# Patient Record
Sex: Female | Born: 1970 | Race: White | Hispanic: No | Marital: Single | Smoking: Former smoker
Health system: Southern US, Community
[De-identification: ages and names within clinical notes are randomized; demographics above are authoritative.]

## PROBLEM LIST (undated history)

## (undated) DIAGNOSIS — K589 Irritable bowel syndrome without diarrhea: Secondary | ICD-10-CM

## (undated) DIAGNOSIS — K219 Gastro-esophageal reflux disease without esophagitis: Secondary | ICD-10-CM

## (undated) DIAGNOSIS — F419 Anxiety disorder, unspecified: Secondary | ICD-10-CM

## (undated) DIAGNOSIS — S322XXA Fracture of coccyx, initial encounter for closed fracture: Secondary | ICD-10-CM

## (undated) DIAGNOSIS — R011 Cardiac murmur, unspecified: Secondary | ICD-10-CM

## (undated) DIAGNOSIS — G709 Myoneural disorder, unspecified: Secondary | ICD-10-CM

## (undated) DIAGNOSIS — T7840XA Allergy, unspecified, initial encounter: Secondary | ICD-10-CM

## (undated) DIAGNOSIS — F329 Major depressive disorder, single episode, unspecified: Secondary | ICD-10-CM

## (undated) DIAGNOSIS — F32A Depression, unspecified: Secondary | ICD-10-CM

## (undated) HISTORY — DX: Cardiac murmur, unspecified: R01.1

## (undated) HISTORY — DX: Gastro-esophageal reflux disease without esophagitis: K21.9

## (undated) HISTORY — PX: COLONOSCOPY: SHX174

## (undated) HISTORY — DX: Anxiety disorder, unspecified: F41.9

## (undated) HISTORY — DX: Allergy, unspecified, initial encounter: T78.40XA

## (undated) HISTORY — DX: Myoneural disorder, unspecified: G70.9

## (undated) HISTORY — DX: Irritable bowel syndrome, unspecified: K58.9

## (undated) HISTORY — DX: Major depressive disorder, single episode, unspecified: F32.9

## (undated) HISTORY — PX: BREAST ENHANCEMENT SURGERY: SHX7

## (undated) HISTORY — DX: Depression, unspecified: F32.A

## (undated) HISTORY — DX: Fracture of coccyx, initial encounter for closed fracture: S32.2XXA

## (undated) HISTORY — PX: AUGMENTATION MAMMAPLASTY: SUR837

---

## 1996-10-18 HISTORY — PX: TUBAL LIGATION: SHX77

## 1998-09-18 ENCOUNTER — Encounter: Payer: Self-pay | Admitting: Emergency Medicine

## 1998-09-18 ENCOUNTER — Emergency Department (HOSPITAL_COMMUNITY): Admission: EM | Admit: 1998-09-18 | Discharge: 1998-09-18 | Payer: Self-pay | Admitting: Emergency Medicine

## 2001-10-12 ENCOUNTER — Other Ambulatory Visit: Admission: RE | Admit: 2001-10-12 | Discharge: 2001-10-12 | Payer: Self-pay | Admitting: Obstetrics and Gynecology

## 2003-03-06 ENCOUNTER — Other Ambulatory Visit: Admission: RE | Admit: 2003-03-06 | Discharge: 2003-03-06 | Payer: Self-pay | Admitting: Obstetrics and Gynecology

## 2004-01-29 ENCOUNTER — Emergency Department (HOSPITAL_COMMUNITY): Admission: EM | Admit: 2004-01-29 | Discharge: 2004-01-29 | Payer: Self-pay | Admitting: Emergency Medicine

## 2004-04-16 ENCOUNTER — Other Ambulatory Visit: Admission: RE | Admit: 2004-04-16 | Discharge: 2004-04-16 | Payer: Self-pay | Admitting: Obstetrics and Gynecology

## 2004-11-26 ENCOUNTER — Other Ambulatory Visit: Admission: RE | Admit: 2004-11-26 | Discharge: 2004-11-26 | Payer: Self-pay | Admitting: Obstetrics and Gynecology

## 2005-04-29 ENCOUNTER — Encounter: Admission: RE | Admit: 2005-04-29 | Discharge: 2005-04-29 | Payer: Self-pay | Admitting: Sports Medicine

## 2005-05-06 ENCOUNTER — Other Ambulatory Visit: Admission: RE | Admit: 2005-05-06 | Discharge: 2005-05-06 | Payer: Self-pay | Admitting: Obstetrics and Gynecology

## 2005-12-15 ENCOUNTER — Ambulatory Visit: Payer: Self-pay | Admitting: Gastroenterology

## 2006-02-07 ENCOUNTER — Ambulatory Visit: Payer: Self-pay | Admitting: Gastroenterology

## 2006-04-17 LAB — HM MAMMOGRAPHY: HM Mammogram: NORMAL

## 2008-01-23 ENCOUNTER — Ambulatory Visit: Payer: Self-pay | Admitting: Sports Medicine

## 2008-01-23 DIAGNOSIS — M545 Low back pain, unspecified: Secondary | ICD-10-CM | POA: Insufficient documentation

## 2008-01-23 DIAGNOSIS — M79609 Pain in unspecified limb: Secondary | ICD-10-CM | POA: Insufficient documentation

## 2008-01-23 DIAGNOSIS — M461 Sacroiliitis, not elsewhere classified: Secondary | ICD-10-CM | POA: Insufficient documentation

## 2008-01-23 DIAGNOSIS — S8410XA Injury of peroneal nerve at lower leg level, unspecified leg, initial encounter: Secondary | ICD-10-CM | POA: Insufficient documentation

## 2008-02-01 ENCOUNTER — Ambulatory Visit: Payer: Self-pay | Admitting: Internal Medicine

## 2008-02-01 ENCOUNTER — Emergency Department: Payer: Self-pay | Admitting: Internal Medicine

## 2008-02-29 ENCOUNTER — Encounter: Payer: Self-pay | Admitting: Family Medicine

## 2008-07-18 LAB — HM PAP SMEAR

## 2008-08-06 ENCOUNTER — Encounter: Payer: Self-pay | Admitting: Family Medicine

## 2008-08-09 ENCOUNTER — Encounter: Payer: Self-pay | Admitting: Family Medicine

## 2008-08-27 ENCOUNTER — Telehealth: Payer: Self-pay | Admitting: Gastroenterology

## 2008-08-28 ENCOUNTER — Emergency Department (HOSPITAL_COMMUNITY): Admission: EM | Admit: 2008-08-28 | Discharge: 2008-08-28 | Payer: Self-pay | Admitting: Emergency Medicine

## 2008-09-04 ENCOUNTER — Encounter: Payer: Self-pay | Admitting: Gastroenterology

## 2008-09-10 ENCOUNTER — Ambulatory Visit: Payer: Self-pay | Admitting: Gastroenterology

## 2008-09-10 DIAGNOSIS — K5900 Constipation, unspecified: Secondary | ICD-10-CM | POA: Insufficient documentation

## 2008-09-10 DIAGNOSIS — R109 Unspecified abdominal pain: Secondary | ICD-10-CM | POA: Insufficient documentation

## 2008-09-10 DIAGNOSIS — K3189 Other diseases of stomach and duodenum: Secondary | ICD-10-CM | POA: Insufficient documentation

## 2008-09-10 DIAGNOSIS — K219 Gastro-esophageal reflux disease without esophagitis: Secondary | ICD-10-CM | POA: Insufficient documentation

## 2008-09-10 DIAGNOSIS — K589 Irritable bowel syndrome without diarrhea: Secondary | ICD-10-CM | POA: Insufficient documentation

## 2008-09-10 DIAGNOSIS — R1013 Epigastric pain: Secondary | ICD-10-CM

## 2008-09-11 LAB — CONVERTED CEMR LAB
AST: 19 units/L (ref 0–37)
Albumin: 3.6 g/dL (ref 3.5–5.2)
Alkaline Phosphatase: 33 units/L — ABNORMAL LOW (ref 39–117)
BUN: 10 mg/dL (ref 6–23)
Calcium: 9.3 mg/dL (ref 8.4–10.5)
Chloride: 102 meq/L (ref 96–112)
GFR calc non Af Amer: 99 mL/min
Glucose, Bld: 81 mg/dL (ref 70–99)
Potassium: 3.3 meq/L — ABNORMAL LOW (ref 3.5–5.1)
Sodium: 141 meq/L (ref 135–145)
Total Protein: 6.6 g/dL (ref 6.0–8.3)

## 2008-09-24 ENCOUNTER — Ambulatory Visit: Payer: Self-pay | Admitting: Gastroenterology

## 2008-11-06 ENCOUNTER — Ambulatory Visit: Payer: Self-pay | Admitting: Gastroenterology

## 2008-12-02 ENCOUNTER — Telehealth: Payer: Self-pay | Admitting: Gastroenterology

## 2008-12-02 ENCOUNTER — Emergency Department (HOSPITAL_COMMUNITY): Admission: EM | Admit: 2008-12-02 | Discharge: 2008-12-02 | Payer: Self-pay | Admitting: Emergency Medicine

## 2009-04-22 ENCOUNTER — Ambulatory Visit: Payer: Self-pay | Admitting: *Deleted

## 2009-04-22 ENCOUNTER — Inpatient Hospital Stay (HOSPITAL_COMMUNITY): Admission: RE | Admit: 2009-04-22 | Discharge: 2009-04-24 | Payer: Self-pay | Admitting: *Deleted

## 2009-04-22 ENCOUNTER — Emergency Department (HOSPITAL_COMMUNITY): Admission: EM | Admit: 2009-04-22 | Discharge: 2009-04-22 | Payer: Self-pay | Admitting: Emergency Medicine

## 2009-06-17 ENCOUNTER — Ambulatory Visit: Payer: Self-pay | Admitting: Family Medicine

## 2009-06-17 DIAGNOSIS — J309 Allergic rhinitis, unspecified: Secondary | ICD-10-CM | POA: Insufficient documentation

## 2009-06-17 DIAGNOSIS — F329 Major depressive disorder, single episode, unspecified: Secondary | ICD-10-CM | POA: Insufficient documentation

## 2009-06-17 DIAGNOSIS — M797 Fibromyalgia: Secondary | ICD-10-CM | POA: Insufficient documentation

## 2009-06-24 ENCOUNTER — Encounter: Payer: Self-pay | Admitting: Family Medicine

## 2009-06-30 ENCOUNTER — Ambulatory Visit: Payer: Self-pay | Admitting: Family Medicine

## 2009-07-01 LAB — CONVERTED CEMR LAB
BUN: 11 mg/dL (ref 6–23)
Bilirubin, Direct: 0 mg/dL (ref 0.0–0.3)
Cholesterol: 188 mg/dL (ref 0–200)
Creatinine, Ser: 0.8 mg/dL (ref 0.4–1.2)
GFR calc non Af Amer: 84.29 mL/min (ref >60)
LDL Cholesterol: 126 mg/dL — ABNORMAL HIGH (ref 0–99)
TSH: 1.13 microintl units/mL (ref 0.35–5.50)
Total Bilirubin: 0.6 mg/dL (ref 0.3–1.2)
Total CHOL/HDL Ratio: 4
Triglycerides: 82 mg/dL (ref 0.0–149.0)
VLDL: 16.4 mg/dL (ref 0.0–40.0)
Vitamin B-12: 473 pg/mL (ref 211–911)

## 2009-07-04 ENCOUNTER — Ambulatory Visit: Payer: Self-pay | Admitting: Family Medicine

## 2009-07-10 ENCOUNTER — Telehealth: Payer: Self-pay | Admitting: Family Medicine

## 2009-07-11 ENCOUNTER — Ambulatory Visit: Payer: Self-pay | Admitting: Family Medicine

## 2009-08-22 ENCOUNTER — Encounter: Payer: Self-pay | Admitting: Family Medicine

## 2009-08-22 ENCOUNTER — Other Ambulatory Visit: Admission: RE | Admit: 2009-08-22 | Discharge: 2009-08-22 | Payer: Self-pay | Admitting: Family Medicine

## 2009-08-22 ENCOUNTER — Ambulatory Visit: Payer: Self-pay | Admitting: Family Medicine

## 2009-08-27 ENCOUNTER — Encounter (INDEPENDENT_AMBULATORY_CARE_PROVIDER_SITE_OTHER): Payer: Self-pay | Admitting: *Deleted

## 2009-10-01 ENCOUNTER — Telehealth: Payer: Self-pay | Admitting: Family Medicine

## 2011-01-24 LAB — CBC
Hemoglobin: 12.6 g/dL (ref 12.0–15.0)
MCHC: 33.8 g/dL (ref 30.0–36.0)
RBC: 4.11 MIL/uL (ref 3.87–5.11)

## 2011-01-24 LAB — ACETAMINOPHEN LEVEL: Acetaminophen (Tylenol), Serum: <10 ug/mL — ABNORMAL LOW (ref 10–30)

## 2011-01-24 LAB — DIFFERENTIAL
Basophils Absolute: 0 10*3/uL (ref 0.0–0.1)
Basophils Relative: 0 % (ref 0–1)
Lymphocytes Relative: 33 % (ref 12–46)
Monocytes Absolute: 0.6 10*3/uL (ref 0.1–1.0)
Monocytes Relative: 7 % (ref 3–12)
Neutro Abs: 5.3 10*3/uL (ref 1.7–7.7)
Neutrophils Relative %: 59 % (ref 43–77)

## 2011-01-24 LAB — BASIC METABOLIC PANEL
CO2: 27 mEq/L (ref 19–32)
Calcium: 8.5 mg/dL (ref 8.4–10.5)
Creatinine, Ser: 0.67 mg/dL (ref 0.4–1.2)
GFR calc Af Amer: >60 mL/min (ref >60)
GFR calc non Af Amer: >60 mL/min (ref >60)
Sodium: 140 mEq/L (ref 135–145)

## 2011-01-24 LAB — ETHANOL: Alcohol, Ethyl (B): <5 mg/dL (ref 0–10)

## 2011-01-24 LAB — RAPID URINE DRUG SCREEN, HOSP PERFORMED
Amphetamines: NOT DETECTED
Opiates: NOT DETECTED
Tetrahydrocannabinol: NOT DETECTED

## 2011-01-24 LAB — POCT PREGNANCY, URINE: Preg Test, Ur: NEGATIVE
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
## 2011-07-20 LAB — POCT URINALYSIS DIP (DEVICE)
Bilirubin Urine: NEGATIVE
Nitrite: NEGATIVE
Operator id: 239701
Protein, ur: NEGATIVE
pH: 5.5

## 2011-07-20 LAB — POCT PREGNANCY, URINE: Preg Test, Ur: NEGATIVE

## 2011-07-20 LAB — WET PREP, GENITAL

## 2011-08-17 ENCOUNTER — Other Ambulatory Visit: Payer: Self-pay | Admitting: Obstetrics and Gynecology

## 2011-09-07 ENCOUNTER — Other Ambulatory Visit: Payer: Self-pay | Admitting: Obstetrics and Gynecology

## 2011-09-07 DIAGNOSIS — R928 Other abnormal and inconclusive findings on diagnostic imaging of breast: Secondary | ICD-10-CM

## 2011-09-21 ENCOUNTER — Ambulatory Visit
Admission: RE | Admit: 2011-09-21 | Discharge: 2011-09-21 | Disposition: A | Discharge status: Home / Self Care | Payer: BC Managed Care – PPO | Source: Ambulatory Visit | Attending: Obstetrics and Gynecology | Admitting: Obstetrics and Gynecology

## 2011-09-21 DIAGNOSIS — R928 Other abnormal and inconclusive findings on diagnostic imaging of breast: Secondary | ICD-10-CM

## 2011-11-08 ENCOUNTER — Ambulatory Visit (INDEPENDENT_AMBULATORY_CARE_PROVIDER_SITE_OTHER): Payer: BC Managed Care – PPO | Admitting: Family Medicine

## 2011-11-08 ENCOUNTER — Encounter: Payer: Self-pay | Admitting: Family Medicine

## 2011-11-08 VITALS — BP 110/70 | HR 68 | Temp 98.4°F | Ht 65.5 in | Wt 144.2 lb

## 2011-11-08 DIAGNOSIS — H6693 Otitis media, unspecified, bilateral: Secondary | ICD-10-CM

## 2011-11-08 DIAGNOSIS — H8309 Labyrinthitis, unspecified ear: Secondary | ICD-10-CM

## 2011-11-08 DIAGNOSIS — H6690 Otitis media, unspecified, unspecified ear: Secondary | ICD-10-CM

## 2011-11-08 MED ORDER — ANTIPYRINE-BENZOCAINE 5.4-1.4 % OT SOLN: 3.0000 [drp] | OTIC | Status: AC | PRN

## 2011-11-08 MED ORDER — AMOXICILLIN 500 MG PO CAPS: 1000.0000 mg | ORAL_CAPSULE | Freq: Two times a day (BID) | ORAL | Status: AC

## 2011-11-08 NOTE — Progress Notes (Signed)
  Patient Name: Kara Haas Date of Birth: 12-28-1970 Age: 41 y.o. Medical Record Number: [id number] Gender: female Date of Encounter: 11/08/2011  History of Present Illness:  Kara Haas is a 41 y.o. very pleasant female patient who presents with the following:  Friday -- pressure in both ears and some difficulty hearing. More on the right compared to the right. Also a lot of nasal congestion. Overall, does not feel well. A little dizzy or off balance.   4-5 recent infections over the winter B ear pressure now No n/v/d, no myalgia or arthralgia.  Past Medical History, Surgical History, Social History, Family History, Problem List, Medications, and Allergies have been reviewed and updated if relevant.  Review of Systems: ROS: GEN: Acute illness details above GI: Tolerating PO intake GU: maintaining adequate hydration and urination Pulm: No SOB Interactive and getting along well at home.  Otherwise, ROS is as per the HPI.   Physical Examination: Filed Vitals:   11/08/11 0935  BP: 110/70  Pulse: 68  Temp: 98.4 F (36.9 C)  TempSrc: Oral  Height: 5' 5.5" (1.664 m)  Weight: 144 lb 4 oz (65.431 kg)    Body mass index is 23.64 kg/(m^2).   Gen: WDWN, NAD; A & O x3, cooperative. Pleasant.Globally Non-toxic HEENT: Normocephalic and atraumatic. Throat clear, w/o exudate, R and L RM obscured landmarks with discolored fluid. rhinnorhea.  MMM Frontal sinuses: NT Max sinuses: NT NECK: Anterior cervical  LAD is absent CV: RRR, No M/G/R, cap refill <2 sec PULM: Breathing comfortably in no respiratory distress. no wheezing, crackles, rhonchi EXT: No c/c/e PSYCH: Friendly, good eye contact MSK: Nml gait    Assessment and Plan: 1. Otitis media of both ears  amoxicillin (AMOXIL) 500 MG capsule, antipyrine-benzocaine (AURALGAN) otic solution  2. Labyrinthitis      Also dramamine prn for mild vertigo

## 2012-02-18 ENCOUNTER — Ambulatory Visit (INDEPENDENT_AMBULATORY_CARE_PROVIDER_SITE_OTHER): Payer: BC Managed Care – PPO | Admitting: Family Medicine

## 2012-02-18 ENCOUNTER — Encounter: Payer: Self-pay | Admitting: Family Medicine

## 2012-02-18 VITALS — BP 124/78 | HR 56 | Temp 98.2°F | Wt 141.8 lb

## 2012-02-18 DIAGNOSIS — J019 Acute sinusitis, unspecified: Secondary | ICD-10-CM

## 2012-02-18 MED ORDER — AMOXICILLIN-POT CLAVULANATE 875-125 MG PO TABS: 1.0000 | ORAL_TABLET | Freq: Two times a day (BID) | ORAL | Status: AC

## 2012-02-18 MED ORDER — FLUTICASONE PROPIONATE 50 MCG/ACT NA SUSP: 2.0000 | Freq: Every day | NASAL | Status: DC

## 2012-02-18 MED ORDER — CETIRIZINE-PSEUDOEPHEDRINE ER 5-120 MG PO TB12: 1.0000 | ORAL_TABLET | Freq: Two times a day (BID) | ORAL | Status: DC

## 2012-02-18 NOTE — Progress Notes (Signed)
  Subjective:    Patient ID: Kara Haas, female    DOB: Feb 04, 1971, 41 y.o.   MRN: [id number]  HPI CC: "i have a sinus infection"  Back 10/2011 had fluid behind ears, given abx abut lasted 3-4 wks, saw ENT and had more abx.  Finally cleared up.  Has had severa URTI this year.  Feels currently with some fluid behind ears, has discussed tympanostomy with ENT.  Last saw ENT 11/2011.  Wonders what she needs to do to help control allergies.  This past week sinuses more congested.  Headache, facial pressure, dizziness.  R ear pain.  Possible low grade fever.  Dry cough present.  + PNdrainage - clear.  Blowing nose with clear mucous.  Dizziness described as worse with sudden movements, associated with sick stomach.  Taking flonase (but not daily), zyrtec, vit C, guaifenesin PRN (lots of water with this).  Has tried claritin and several other brands.    No fevers/chills, n/v, rash, tooth pain.   Sick contacts at work.  Smoking 2 cig/day.  Motivated to quit.  No h/o asthma.  Review of Systems Per HPI    Objective:   Physical Exam  Nursing note and vitals reviewed. Constitutional: She appears well-developed and well-nourished. No distress.  HENT:  Head: Normocephalic and atraumatic.  Right Ear: Hearing, external ear and ear canal normal.  Left Ear: Hearing, tympanic membrane, external ear and ear canal normal.  Nose: Mucosal edema and rhinorrhea present. Right sinus exhibits frontal sinus tenderness. Right sinus exhibits no maxillary sinus tenderness. Left sinus exhibits frontal sinus tenderness. Left sinus exhibits no maxillary sinus tenderness.  Mouth/Throat: Uvula is midline, oropharynx is clear and moist and mucous membranes are normal. No oropharyngeal exudate, posterior oropharyngeal edema, posterior oropharyngeal erythema or tonsillar abscesses.       Fluid behind R TM PNdrainage present  Eyes: Conjunctivae and EOM are normal. Pupils are equal, round, and reactive to light. No scleral  icterus.  Neck: Normal range of motion. Neck supple.  Cardiovascular: Normal rate, regular rhythm, normal heart sounds and intact distal pulses.   No murmur heard. Pulmonary/Chest: Effort normal and breath sounds normal. No respiratory distress. She has no wheezes. She has no rales.  Lymphadenopathy:    She has no cervical adenopathy.  Skin: Skin is warm and dry. No rash noted.       Assessment & Plan:

## 2012-02-18 NOTE — Assessment & Plan Note (Signed)
Given history, as well as current serous otitis, will treat aggressively with augmentin. ?vestibular neuritis component leading to dizziness. See pt instructions for other recommendations. Pt declines dizziness med. If not better, update [place], consider singulair for allergies.

## 2012-02-18 NOTE — Patient Instructions (Signed)
You have a sinus infection along with fluid in right ear. Take medicine as prescribed: Start augmentin twice daily for 10 days.  Start flonase regularly.  Start zyrtec D twice a day. Push fluids and plenty of rest. Nasal saline irrigation or neti pot to help drain sinuses. May use simple mucinex with plenty of fluid to help mobilize mucous. Let us know if fever >101.5, trouble opening/closing mouth, difficulty swallowing, or worsening - you may need to be seen again.

## 2012-08-10 ENCOUNTER — Other Ambulatory Visit: Payer: Self-pay | Admitting: Family Medicine

## 2012-08-10 ENCOUNTER — Other Ambulatory Visit: Payer: Self-pay | Admitting: Obstetrics and Gynecology

## 2012-08-10 DIAGNOSIS — Z1231 Encounter for screening mammogram for malignant neoplasm of breast: Secondary | ICD-10-CM

## 2012-08-31 ENCOUNTER — Ambulatory Visit
Admission: RE | Admit: 2012-08-31 | Discharge: 2012-08-31 | Disposition: A | Discharge status: Home / Self Care | Payer: BC Managed Care – PPO | Source: Ambulatory Visit | Attending: Obstetrics and Gynecology | Admitting: Obstetrics and Gynecology

## 2012-08-31 DIAGNOSIS — Z1231 Encounter for screening mammogram for malignant neoplasm of breast: Secondary | ICD-10-CM

## 2012-09-15 ENCOUNTER — Other Ambulatory Visit: Payer: Self-pay | Admitting: Obstetrics and Gynecology

## 2012-09-15 DIAGNOSIS — R928 Other abnormal and inconclusive findings on diagnostic imaging of breast: Secondary | ICD-10-CM

## 2012-09-26 ENCOUNTER — Ambulatory Visit
Admission: RE | Admit: 2012-09-26 | Discharge: 2012-09-26 | Disposition: A | Discharge status: Home / Self Care | Payer: BC Managed Care – PPO | Source: Ambulatory Visit | Attending: Obstetrics and Gynecology | Admitting: Obstetrics and Gynecology

## 2012-09-26 ENCOUNTER — Other Ambulatory Visit: Payer: Self-pay | Admitting: Obstetrics and Gynecology

## 2012-09-26 ENCOUNTER — Other Ambulatory Visit (HOSPITAL_COMMUNITY)
Admission: RE | Admit: 2012-09-26 | Discharge: 2012-09-26 | Disposition: A | Discharge status: Home / Self Care | Payer: BC Managed Care – PPO | Source: Ambulatory Visit | Attending: Diagnostic Radiology | Admitting: Diagnostic Radiology

## 2012-09-26 DIAGNOSIS — R928 Other abnormal and inconclusive findings on diagnostic imaging of breast: Secondary | ICD-10-CM

## 2012-09-26 DIAGNOSIS — N6009 Solitary cyst of unspecified breast: Secondary | ICD-10-CM | POA: Insufficient documentation

## 2012-11-28 ENCOUNTER — Ambulatory Visit (INDEPENDENT_AMBULATORY_CARE_PROVIDER_SITE_OTHER): Payer: BC Managed Care – PPO | Admitting: Family Medicine

## 2012-11-28 ENCOUNTER — Encounter: Payer: Self-pay | Admitting: Family Medicine

## 2012-11-28 VITALS — BP 110/72 | HR 75 | Temp 98.8°F | Ht 65.5 in | Wt 144.8 lb

## 2012-11-28 DIAGNOSIS — J019 Acute sinusitis, unspecified: Secondary | ICD-10-CM

## 2012-11-28 MED ORDER — AMOXICILLIN 500 MG PO TABS: 1000.0000 mg | ORAL_TABLET | Freq: Two times a day (BID) | ORAL | Status: DC

## 2012-11-28 MED ORDER — FLUTICASONE PROPIONATE 50 MCG/ACT NA SUSP: NASAL | Status: DC

## 2012-11-28 NOTE — Patient Instructions (Addendum)
Mucinex, nasal saline 2-3 time a day. Continue allergy medications. Use amoxicillin for 10 days. Call if not improving as expected.

## 2012-11-28 NOTE — Progress Notes (Signed)
  Subjective:    Patient ID: Kara Haas, female    DOB: April 30, 1971, 42 y.o.   MRN: [id number]  HPI 42 year old female presents with  1 week of frontal headache, congestion, low grade temp in last 3 days. Body ache. No SOB. Decreased energy.  This AM maxillary and frontal sinus pain worse. Headache. Ears painful B.  Tooth pain on bottom.  Thick yellow purulent mucus.  Using mucinex cold and flu. Pheneylephperine for decongestant. Flonase and allergy meds. Minimal help with symptoms.    Review of Systems  Constitutional: Negative for fever and fatigue.  HENT: Negative for ear pain.   Eyes: Negative for pain.  Respiratory: Negative for chest tightness and shortness of breath.   Cardiovascular: Negative for chest pain, palpitations and leg swelling.  Gastrointestinal: Negative for abdominal pain.  Genitourinary: Negative for dysuria.       Objective:   Physical Exam  Constitutional: Vital signs are normal. She appears well-developed and well-nourished. She is cooperative.  Non-toxic appearance. She does not appear ill. No distress.  HENT:  Head: Normocephalic.  Right Ear: Hearing, tympanic membrane, external ear and ear canal normal. Tympanic membrane is not erythematous, not retracted and not bulging.  Left Ear: Hearing, tympanic membrane, external ear and ear canal normal. Tympanic membrane is not erythematous, not retracted and not bulging.  Nose: Mucosal edema and rhinorrhea present. Right sinus exhibits maxillary sinus tenderness and frontal sinus tenderness. Left sinus exhibits maxillary sinus tenderness and frontal sinus tenderness.  Mouth/Throat: Uvula is midline, oropharynx is clear and moist and mucous membranes are normal.  Eyes: Conjunctivae, EOM and lids are normal. Pupils are equal, round, and reactive to light. No foreign bodies found.  Neck: Trachea normal and normal range of motion. Neck supple. Carotid bruit is not present. No mass and no thyromegaly present.   Cardiovascular: Normal rate, regular rhythm, S1 normal, S2 normal, normal heart sounds, intact distal pulses and normal pulses.  Exam reveals no gallop and no friction rub.   No murmur heard. Pulmonary/Chest: Effort normal and breath sounds normal. Not tachypneic. No respiratory distress. She has no decreased breath sounds. She has no wheezes. She has no rhonchi. She has no rales.  Neurological: She is alert.  Skin: Skin is warm, dry and intact. No rash noted.  Psychiatric: Her speech is normal and behavior is normal. Judgment normal. Her mood appears not anxious. Cognition and memory are normal. She does not exhibit a depressed mood.          Assessment & Plan:

## 2012-11-28 NOTE — Assessment & Plan Note (Signed)
Given length of time of symptoms and new fever. Treat with antibiotics and symptomatic care.

## 2013-01-17 ENCOUNTER — Telehealth: Payer: Self-pay | Admitting: Family Medicine

## 2013-01-17 ENCOUNTER — Telehealth: Payer: Self-pay

## 2013-01-17 NOTE — Telephone Encounter (Signed)
Agree with dispo in RLQ pain.   Hannah Beat, MD 01/17/2013, 7:27 PM

## 2013-01-17 NOTE — Telephone Encounter (Signed)
CAN calls at 5 pm with disposition for pt to be seen now;  This morning 1 am to approx 4 am had severe RLQ stabbing pain; thru out today pt has had moderate pain constantly in RLQ with abd bloating. No fever.  Advised at 5 pm if CAN advises to be seen now pt will need to be eval at ED. Camera will notify pt.

## 2013-01-17 NOTE — Telephone Encounter (Signed)
Patient Information:  Caller Name: Summar  Phone: [phone]  Patient: Kara Haas  Gender: Female  DOB: 15-Aug-1971  Age: 42 Years  PCP: Kerby Nora (Family Practice)  Pregnant: No  Office Follow Up:  Does the office need to follow up with this patient?: Yes  Instructions For The Office: Spoke with Raena, nurse, at the office and she agreed too late to be seen at the office and ok to go to the ED.  RN Note:  Onset 01/17/2013  woke up @ 1:00 with SEVERE lower right quadrant pain and was not relieved by taking Advil (X3tablets)  until 3:30 when she fell asleep. She awoke again with pain and could not move. Since waking up and at work, has constant MODERATE paint "sore" below naval and more lateral). She does have abdomen bloating (she states PMS, due "anyday". Did have a normal bowel movement yesterday and states, "it is not constipation". She denies any fever.   Symptoms  Reason For Call & Symptoms: 01/18/2012 from 1am-3am had intense pain lower right side and could not move. ,  Reviewed Health History In EMR: Yes  Reviewed Medications In EMR: Yes  Reviewed Allergies In EMR: Yes  Reviewed Surgeries / Procedures: Yes  Date of Onset of Symptoms: 01/17/2013  Treatments Tried: ADVIL 3 tablets, 600mg  @ 1L30and got a relief by 3:30.  Treatments Tried Worked: No OB / GYN:  LMP: 12/19/2012  Guideline(s) Used:  Abdominal Pain - Female  Disposition Per Guideline:   See Today in Office  Reason For Disposition Reached:   Patient wants to be seen  Advice Given:  Reassurance:  A mild stomachache can be caused by indigestion, gas pains or overeating. Sometimes a stomachache signals the onset of a vomiting illness due to a viral gastroenteritis ("stomach flu").  Here is some care advice that should help.  Rest:  Lie down and rest until you feel better.  Fluids:  Sip clear fluids only (e.g., water, flat soft drinks or 1/2 strength fruit juice) until the pain has been gone for over 2 hours.  Then slowly return to a regular diet.  Pass a BM:  Sit on the toilet and try to pass a bowel movement (BM). Do not strain. This may relieve the pain if it is due to constipation or impending diarrhea.  Pass a BM:  Sit on the toilet and try to pass a bowel movement (BM). Do not strain. This may relieve the pain if it is due to constipation or impending diarrhea.  Call Back If:  Abdominal pain is constant and present for more than 2 hours  You become worse.  RN Overrode Recommendation:  Go To U.C.  RN/CAN advised going to the ED, she preferred Urgent Care if acceptable. RN/CAN stated ok to be seen in Urgent Care, they would do detailed assessment and if necessary send to the ED.

## 2013-01-18 ENCOUNTER — Ambulatory Visit (INDEPENDENT_AMBULATORY_CARE_PROVIDER_SITE_OTHER)
Admission: RE | Admit: 2013-01-18 | Discharge: 2013-01-18 | Disposition: A | Discharge status: Home / Self Care | Payer: BC Managed Care – PPO | Source: Ambulatory Visit | Attending: Family Medicine | Admitting: Family Medicine

## 2013-01-18 ENCOUNTER — Encounter: Payer: Self-pay | Admitting: Family Medicine

## 2013-01-18 ENCOUNTER — Ambulatory Visit (INDEPENDENT_AMBULATORY_CARE_PROVIDER_SITE_OTHER): Payer: BC Managed Care – PPO | Admitting: Family Medicine

## 2013-01-18 VITALS — BP 100/70 | HR 58 | Temp 98.3°F | Wt 143.5 lb

## 2013-01-18 DIAGNOSIS — R1031 Right lower quadrant pain: Secondary | ICD-10-CM

## 2013-01-18 DIAGNOSIS — R109 Unspecified abdominal pain: Secondary | ICD-10-CM

## 2013-01-18 LAB — CBC WITH DIFFERENTIAL/PLATELET
Basophils Relative: 0.4 % (ref 0.0–3.0)
Eosinophils Absolute: 0.1 10*3/uL (ref 0.0–0.7)
Eosinophils Relative: 1.1 % (ref 0.0–5.0)
HCT: 37.2 % (ref 36.0–46.0)
Lymphs Abs: 1.9 10*3/uL (ref 0.7–4.0)
MCHC: 33.5 g/dL (ref 30.0–36.0)
MCV: 88.2 fl (ref 78.0–100.0)
Monocytes Absolute: 0.4 10*3/uL (ref 0.1–1.0)
Neutrophils Relative %: 63.1 % (ref 43.0–77.0)
Platelets: 230 10*3/uL (ref 150.0–400.0)
RBC: 4.21 Mil/uL (ref 3.87–5.11)
WBC: 6.5 10*3/uL (ref 4.5–10.5)

## 2013-01-18 LAB — POCT UA - MICROSCOPIC ONLY

## 2013-01-18 LAB — COMPREHENSIVE METABOLIC PANEL
AST: 15 U/L (ref 0–37)
Alkaline Phosphatase: 35 U/L — ABNORMAL LOW (ref 39–117)
BUN: 9 mg/dL (ref 6–23)
Glucose, Bld: 83 mg/dL (ref 70–99)
Total Bilirubin: 0.7 mg/dL (ref 0.3–1.2)

## 2013-01-18 LAB — POCT URINALYSIS DIPSTICK
Bilirubin, UA: NEGATIVE
Glucose, UA: NEGATIVE
Spec Grav, UA: <=1.005
Urobilinogen, UA: 0.2

## 2013-01-18 MED ORDER — IOHEXOL 300 MG/ML  SOLN
100.0000 mL | Freq: Once | INTRAMUSCULAR | Status: AC | PRN
  Administered 2013-01-18: 100 mL via INTRAVENOUS

## 2013-01-18 NOTE — Progress Notes (Signed)
  Subjective:    Patient ID: Kara Haas, female    DOB: [birth year], 42 y.o.   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 Went away with 3 advil. Following morning right abdomen was sore. Some bloating. Last night awoke again in middle of night with pain same area, not as severe  Urine today looked like it had vaginal white stringy discharge. When she urinates she feels some pressure on right side.  Mild nausea. No vomiting, no diarrhea or constipation. No fever. No dysuria, no frequency, no urgency.  She is sexually active.. No new partner, no concern about STDs.  no vaginal lesions or itching.  Menses is due anytime this week. Usually has bad cramps but nothing like this before.  She still has appendix and ovaries.  She had UTI 2 weeks ago.. Took AZO symptoms reolved on own.   Review of Systems  Constitutional: Negative for fever and fatigue.  HENT: Negative for ear pain.   Eyes: Negative for pain.  Respiratory: Negative for chest tightness and shortness of breath.   Cardiovascular: Negative for chest pain, palpitations and leg swelling.  Gastrointestinal: Positive for abdominal pain. Negative for anal bleeding.  Genitourinary: Positive for vaginal discharge. Negative for dysuria, urgency, frequency, vaginal pain and pelvic pain.       Objective:   Physical Exam  Constitutional: Vital signs are normal. She appears well-developed and well-nourished. She is cooperative.  Non-toxic appearance. She does not appear ill. No distress.  HENT:  Head: Normocephalic.  Right Ear: Hearing, tympanic membrane, external ear and ear canal normal. Tympanic membrane is not erythematous, not retracted and not bulging.  Left Ear: Hearing, tympanic membrane, external ear and ear canal normal. Tympanic membrane is not erythematous, not retracted and not  bulging.  Nose: No mucosal edema or rhinorrhea. Right sinus exhibits no maxillary sinus tenderness and no frontal sinus tenderness. Left sinus exhibits no maxillary sinus tenderness and no frontal sinus tenderness.  Mouth/Throat: Uvula is midline, oropharynx is clear and moist and mucous membranes are normal.  Eyes: Conjunctivae, EOM and lids are normal. Pupils are equal, round, and reactive to light. No foreign bodies found.  Neck: Trachea normal and normal range of motion. Neck supple. Carotid bruit is not present. No mass and no thyromegaly present.  Cardiovascular: Normal rate, regular rhythm, S1 normal, S2 normal, normal heart sounds, intact distal pulses and normal pulses.  Exam reveals no gallop and no friction rub.   No murmur heard. Pulmonary/Chest: Effort normal and breath sounds normal. Not tachypneic. No respiratory distress. She has no decreased breath sounds. She has no wheezes. She has no rhonchi. She has no rales.  Abdominal: Soft. Normal appearance and bowel sounds are normal. There is no hepatosplenomegaly. There is tenderness in the right lower quadrant and periumbilical area. There is no rebound, no CVA tenderness and negative Murphy's sign. No hernia.  Neurological: She is alert.  Skin: Skin is warm, dry and intact. No rash noted.  Psychiatric: Her speech is normal and behavior is normal. Judgment and thought content normal. Her mood appears not anxious. Cognition and memory are normal. She does not exhibit a depressed mood.          Assessment & Plan:

## 2013-01-18 NOTE — Patient Instructions (Addendum)
Labs before you leave. MARION: Please have call report sent to my cell. Stop at front desk to schedule CT today.

## 2013-01-18 NOTE — Assessment & Plan Note (Signed)
UA contaminated with skin cells.. Doubt UTI bevcasue not typical symptoms. Possible stone  Vs. Appendicitis, vs ovarian cyst.  Sent  For eval with CT abd/Pelvis.  Will also check labs with cbc, CMET.

## 2013-04-27 ENCOUNTER — Encounter: Payer: Self-pay | Admitting: Family Medicine

## 2013-04-27 ENCOUNTER — Telehealth: Payer: Self-pay

## 2013-04-27 ENCOUNTER — Other Ambulatory Visit: Payer: BC Managed Care – PPO

## 2013-04-27 ENCOUNTER — Ambulatory Visit (INDEPENDENT_AMBULATORY_CARE_PROVIDER_SITE_OTHER): Payer: BC Managed Care – PPO | Admitting: Family Medicine

## 2013-04-27 VITALS — BP 110/74 | HR 85 | Temp 98.3°F | Ht 65.5 in | Wt 150.2 lb

## 2013-04-27 DIAGNOSIS — J019 Acute sinusitis, unspecified: Secondary | ICD-10-CM

## 2013-04-27 DIAGNOSIS — R1031 Right lower quadrant pain: Secondary | ICD-10-CM

## 2013-04-27 MED ORDER — AMOXICILLIN 500 MG PO CAPS: 1000.0000 mg | ORAL_CAPSULE | Freq: Two times a day (BID) | ORAL | Status: DC

## 2013-04-27 NOTE — Telephone Encounter (Signed)
Pt left v/m that pt wanted Dr Patsy Lager to know she did not go for CT; pt saw GYN and [place] was done and said pain in rt side was caused by ovarian cyst.

## 2013-04-27 NOTE — Telephone Encounter (Signed)
Rose with [facility] called pt decided not to have CT; pt is going to contact GYN for [place].

## 2013-04-27 NOTE — Telephone Encounter (Signed)
She can make that choice. This is against my medical advice.   Hannah Beat, MD 04/27/2013, 3:44 PM

## 2013-04-27 NOTE — Progress Notes (Signed)
Nature conservation officer at [facility] [address] [place] [address] Phone: [phone] Fax: [phone]  Date:  04/27/2013   Name:  Kara Haas   DOB:  [birth year]   MRN:  [id number] Gender: female Age: 42 y.o.  Primary Physician:  Kerby Nora, MD  Evaluating MD: Hannah Beat, MD   Chief Complaint: Cough and Sinusitis   History of Present Illness:  Kara Haas is a 42 y.o. pleasant patient who presents with the following:  Having some problems with stomach. Felt a wave of nausea. Felt like on a boat, but also with some sinus pressure and pain. Some pain in the right ear. Last week on Tuesday, was having some diarrhea and some headache.   Significant sinus pain, mostly in the frontal sinuses. She also is having some right-sided lower quadrant pain. This is been over the last week and has been dramatically worsened in the last 1-2 days. She relates she is having difficulty walking.  Patient Active Problem List   Diagnosis Date Noted  . Right lower quadrant abdominal pain 01/18/2013  . DEPRESSION, MAJOR 06/17/2009  . ALLERGIC RHINITIS 06/17/2009  . FIBROMYALGIA 06/17/2009  . GERD 09/10/2008  . DYSPEPSIA 09/10/2008  . CONSTIPATION 09/10/2008  . IRRITABLE BOWEL SYNDROME 09/10/2008  . ABDOMINAL PAIN 09/10/2008  . SACROILIITIS NOT ELSEWHERE CLASSIFIED 01/23/2008  . LOW BACK PAIN 01/23/2008  . LEG PAIN, LEFT 01/23/2008  . PERONEAL NEUROPATHY 01/23/2008    Past Medical History  Diagnosis Date  . Fracture of coccyx     H/O distantly  . GERD (gastroesophageal reflux disease)   . IBS (irritable bowel syndrome)   . Anxiety disorder   . Depression   . Gout   . Allergic rhinitis     Past Surgical History  Procedure Laterality Date  . Breast enhancement surgery      History   Social History  . Marital Status: Single    Spouse Name: N/A    Number of Children: N/A  . Years of Education: N/A   Occupational History  . Gaffer    Social  History Main Topics  . Smoking status: Current Every Day Smoker    Types: Cigarettes  . Smokeless tobacco: Not on file     Comment: 2 cigarettes daily  . Alcohol Use: Yes     Comment: wine 2-3 glasses a night in past, but now 1 glass per night  . Drug Use: No  . Sexually Active: Not on file   Other Topics Concern  . Not on file   Social History Narrative   Divorced   Daily caffeine- Use 1 coffee/day   Patient gets regular exercise    Family History  Problem Relation Age of Onset  . Cancer Father     prostate  . Cancer Maternal Grandmother 56    colon  . Diabetes Other     both sides of family    Allergies  Allergen Reactions  . Sulfonamide Derivatives     REACTION: Rash as a teenager    Medication list has been reviewed and updated.  Outpatient Prescriptions Prior to Visit  Medication Sig Dispense Refill  . ibuprofen (ADVIL,MOTRIN) 200 MG tablet Take 200 mg by mouth every 6 (six) hours as needed.      . vitamin E 100 UNIT capsule Take 100 Units by mouth daily.      . cetirizine (ZYRTEC) 10 MG tablet Take 10 mg by mouth daily.      Marland Kitchen  fluticasone (FLONASE) 50 MCG/ACT nasal spray USE 2 SPRAYS IN EACH NOSTRIL ONCE A DAY  16 g  3  . pseudoephedrine-acetaminophen (TYLENOL SINUS) 30-500 MG TABS Take 1 tablet by mouth every 4 (four) hours as needed.       No facility-administered medications prior to visit.    Review of Systems:  Nausea, occasional diarrhea. Decreased intake. No fever. Abdominal pain. Nasal congestion. Sinus pressure and pain. Some ear fullness. Otherwise, the pertinent positives and negatives are listed above and in the HPI, otherwise a full review of systems has been reviewed and is negative unless noted positive.   Physical Examination: BP 110/74  Pulse 85  Temp(Src) 98.3 F (36.8 C) (Oral)  Ht 5' 5.5" (1.664 m)  Wt 150 lb 4 oz (68.153 kg)  BMI 24.61 kg/m2  SpO2 97%  Ideal Body Weight: Weight in (lb) to have BMI = 25: 152.2  GEN: WDWN, NAD,  Non-toxic, A & O x 3 HEENT: Atraumatic, Normocephalic. Neck supple. No masses, No LAD. Ears and Nose: No external deformity. Sinuses are tender in both of the maxillary and frontal region. CV: RRR, No M/G/R. No JVD. No thrill. No extra heart sounds. PULM: CTA B, no wheezes, crackles, rhonchi. No retractions. No resp. distress. No accessory muscle use. ABD:  Notable right lower corner and tenderness. Positive rebound. No right upper quadrant tenderness. There is some tenderness in the left lower corner. Patient does have pain when he strike her heel and foot. No HSM. EXTR: No c/c/e NEURO Normal gait.  PSYCH: Normally interactive. Conversant. Not depressed or anxious appearing.  Calm demeanor.    Assessment and Plan:  Right lower quadrant abdominal pain - Plan: CT Abdomen Pelvis W Contrast  Sinusitis, acute  Acute sinusitis. Treat with high-dose amoxicillin.  Clinical concern for potential appendicitis, or other acute intra-abdominal process. Obtain a CT of the abdomen and pelvis with contrast to evaluate for potential appendicitis or acute intra-abdominal process.  The patient called our office after her office visit and left. She indicated that she would not be obtaining the CT that we recommended. Later that day, she called, after seeing her gynecologist, and the patient related that she felt as if her right lower quadrant pain were secondary to an ovarian cyst.  Orders Today:  Orders Placed This Encounter  Procedures  . CT Abdomen Pelvis W Contrast    RM/MARION [phone]/PT NOT DIAB/NO LABS NEEDED/[employer] [id number]/PT TO DRINK WB CM(TO ARRIVE @ 1:30 TO DRINK)    Standing Status: Future     Number of Occurrences:      Standing Expiration Date: 07/28/2014    Order Specific Question:  Is the patient pregnant?    Answer:  No    Order Specific Question:  Preferred imaging location?    Answer:  [facility]    Order Specific Question:  Reason for exam:    Answer:  severe RLQ pain,  concern for potential appendicitis, diverticulitis, other intraabdominal pathology    Updated Medication List: (Includes new medications, updates to list, dose adjustments) Meds ordered this encounter  Medications  . fluticasone (FLONASE) 50 MCG/ACT nasal spray    Sig: Place 2 sprays into the nose daily.  . Diphenhydramine-Phenylephrine (SUDAFED PE DAY & NIGHT PO)    Sig: Take by mouth.  Marland Kitchen amoxicillin (AMOXIL) 500 MG capsule    Sig: Take 2 capsules (1,000 mg total) by mouth 2 (two) times daily.    Dispense:  40 capsule    Refill:  0  Medications Discontinued: Medications Discontinued During This Encounter  Medication Reason  . pseudoephedrine-acetaminophen (TYLENOL SINUS) 30-500 MG TABS Error  . fluticasone (FLONASE) 50 MCG/ACT nasal spray Error  . cetirizine (ZYRTEC) 10 MG tablet Error      Signed, Adalai Perl T. Willliam Pettet, MD 04/27/2013 10:26 AM

## 2013-04-27 NOTE — Patient Instructions (Addendum)
REFERRAL: GO THE THE FRONT ROOM AT THE ENTRANCE OF OUR CLINIC, NEAR CHECK IN. ASK FOR MARION. SHE WILL HELP YOU SET UP YOUR REFERRAL. DATE: TIME:  

## 2013-04-27 NOTE — Telephone Encounter (Signed)
Ok -- very good to know and reassuring.

## 2013-11-16 ENCOUNTER — Encounter: Payer: Self-pay | Admitting: Family Medicine

## 2013-11-16 ENCOUNTER — Ambulatory Visit (INDEPENDENT_AMBULATORY_CARE_PROVIDER_SITE_OTHER): Payer: BC Managed Care – PPO | Admitting: Family Medicine

## 2013-11-16 VITALS — BP 90/64 | HR 67 | Temp 98.4°F | Ht 65.5 in | Wt 150.5 lb

## 2013-11-16 DIAGNOSIS — G5700 Lesion of sciatic nerve, unspecified lower limb: Secondary | ICD-10-CM

## 2013-11-16 DIAGNOSIS — M545 Low back pain, unspecified: Secondary | ICD-10-CM

## 2013-11-16 DIAGNOSIS — G5701 Lesion of sciatic nerve, right lower limb: Secondary | ICD-10-CM

## 2013-11-16 DIAGNOSIS — S161XXA Strain of muscle, fascia and tendon at neck level, initial encounter: Secondary | ICD-10-CM

## 2013-11-16 DIAGNOSIS — S139XXA Sprain of joints and ligaments of unspecified parts of neck, initial encounter: Secondary | ICD-10-CM

## 2013-11-16 MED ORDER — MELOXICAM 15 MG PO TABS: 15.0000 mg | ORAL_TABLET | Freq: Every day | ORAL | Status: DC

## 2013-11-16 MED ORDER — FLUTICASONE PROPIONATE 50 MCG/ACT NA SUSP: 2.0000 | Freq: Every day | NASAL | Status: DC

## 2013-11-16 NOTE — Assessment & Plan Note (Signed)
NSAID, home PT ( info given for home stretches), heat, massage. 

## 2013-11-16 NOTE — Progress Notes (Signed)
Pre-visit discussion using our clinic review tool. No additional management support is needed unless otherwise documented below in the visit note.  

## 2013-11-16 NOTE — Assessment & Plan Note (Addendum)
NSAID, home PT ( info given for home stretches), heat, massage.

## 2013-11-16 NOTE — Progress Notes (Signed)
   Subjective:    Patient ID: Kara Haas, female    DOB: 01/09/1971, 43 y.o.   MRN: [id number]  HPI  43 year old female pt presents after fall 1 month ago... She lost balance and fell directly on right buttock.  Limited pain in right buttock but has increased with time.  She has taken aleve prn for back pain. Occ slight pain radiating down leg. No numbness, no weakness.   She has also been having tenderness in left upper back.  Occurred after pull of muscle picking up purse with turning.  Has been getting massage. Also seeing chiropractor.  Feels muscle spasming at time.  No radiating pain into left arm, no weakness, no numbness.    Review of Systems  Constitutional: Negative for fever and fatigue.  HENT: Negative for ear pain.   Eyes: Negative for pain.  Respiratory: Negative for chest tightness and shortness of breath.   Cardiovascular: Negative for chest pain, palpitations and leg swelling.  Gastrointestinal: Negative for abdominal pain.  Genitourinary: Negative for dysuria.       Objective:   Physical Exam  Constitutional: Vital signs are normal. She appears well-developed and well-nourished. She is cooperative.  Non-toxic appearance. She does not appear ill. No distress.  HENT:  Head: Normocephalic.  Right Ear: Hearing, tympanic membrane, external ear and ear canal normal. Tympanic membrane is not erythematous, not retracted and not bulging.  Left Ear: Hearing, tympanic membrane, external ear and ear canal normal. Tympanic membrane is not erythematous, not retracted and not bulging.  Nose: No mucosal edema or rhinorrhea. Right sinus exhibits no maxillary sinus tenderness and no frontal sinus tenderness. Left sinus exhibits no maxillary sinus tenderness and no frontal sinus tenderness.  Mouth/Throat: Uvula is midline, oropharynx is clear and moist and mucous membranes are normal.  Eyes: Conjunctivae, EOM and lids are normal. Pupils are equal, round, and reactive to  light. Lids are everted and swept, no foreign bodies found.  Neck: Trachea normal and normal range of motion. Neck supple. Carotid bruit is not present. No mass and no thyromegaly present.  Cardiovascular: Normal rate, regular rhythm, S1 normal, S2 normal, normal heart sounds, intact distal pulses and normal pulses.  Exam reveals no gallop and no friction rub.   No murmur heard. Pulmonary/Chest: Effort normal and breath sounds normal. Not tachypneic. No respiratory distress. She has no decreased breath sounds. She has no wheezes. She has no rhonchi. She has no rales.  Abdominal: Soft. Normal appearance and bowel sounds are normal. There is no tenderness.  Musculoskeletal:       Cervical back: She exhibits decreased range of motion, tenderness and spasm. She exhibits no bony tenderness and no swelling.       Lumbar back: She exhibits decreased range of motion. She exhibits no tenderness and no bony tenderness.  ttp in right sciatic notch over piriformis, positive faber's   ttp over left trapezius  Neurological: She is alert.  Skin: Skin is warm, dry and intact. No rash noted.  Psychiatric: Her speech is normal and behavior is normal. Judgment and thought content normal. Her mood appears not anxious. Cognition and memory are normal. She does not exhibit a depressed mood.          Assessment & Plan:

## 2013-11-16 NOTE — Patient Instructions (Addendum)
Stop ibuprofen. Start meloxicam for pain in neck ans buttock. While on this medication start prilosec 20 mg daily. Continue massage.  Start home PT.  Follow up if not improving in 2 weeks.

## 2013-11-21 ENCOUNTER — Telehealth: Payer: Self-pay | Admitting: Family Medicine

## 2013-11-21 NOTE — Telephone Encounter (Signed)
Relevant patient education assigned to patient using Emmi. ° °

## 2014-01-22 ENCOUNTER — Other Ambulatory Visit: Payer: Self-pay | Admitting: Family Medicine

## 2014-03-14 ENCOUNTER — Encounter: Payer: Self-pay | Admitting: Podiatry

## 2014-03-14 ENCOUNTER — Ambulatory Visit (INDEPENDENT_AMBULATORY_CARE_PROVIDER_SITE_OTHER): Payer: BC Managed Care – PPO

## 2014-03-14 ENCOUNTER — Ambulatory Visit (INDEPENDENT_AMBULATORY_CARE_PROVIDER_SITE_OTHER): Payer: BC Managed Care – PPO | Admitting: Podiatry

## 2014-03-14 DIAGNOSIS — R52 Pain, unspecified: Secondary | ICD-10-CM

## 2014-03-14 DIAGNOSIS — M7660 Achilles tendinitis, unspecified leg: Secondary | ICD-10-CM

## 2014-03-14 DIAGNOSIS — M779 Enthesopathy, unspecified: Secondary | ICD-10-CM

## 2014-03-14 DIAGNOSIS — M722 Plantar fascial fibromatosis: Secondary | ICD-10-CM

## 2014-03-14 MED ORDER — MELOXICAM 15 MG PO TABS: 15.0000 mg | ORAL_TABLET | Freq: Every day | ORAL | Status: DC

## 2014-03-14 MED ORDER — TRIAMCINOLONE ACETONIDE 10 MG/ML IJ SUSP
10.0000 mg | Freq: Once | INTRAMUSCULAR | Status: AC
  Administered 2014-03-14: 10 mg

## 2014-03-14 NOTE — Progress Notes (Signed)
   Subjective:    Patient ID: Kara Haas, female    DOB: 09/14/1971, 43 y.o.   MRN: [id number]  HPI PT STATED RT FOOT BACK OF THE HEEL AND FRONT ANKLE HAVING BURNING SENSATION AND SORE FOR 1 MONTH. THE FOOT IS GETTING WORSE. THE FOOT GET AGGRAVATED BY WALKING/ SITTING. TRIED TO USED BIOFREEZE AND IT HELP SOME.   Review of Systems  Musculoskeletal: Positive for back pain and gait problem.       Objective:   Physical Exam        Assessment & Plan:

## 2014-03-14 NOTE — Patient Instructions (Signed)
Achilles Tendinitis  with Rehab Achilles tendinitis is a disorder of the Achilles tendon. The Achilles tendon connects the large calf muscles (Gastrocnemius and Soleus) to the heel bone (calcaneus). This tendon is sometimes called the heel cord. It is important for pushing-off and standing on your toes and is important for walking, running, or jumping. Tendinitis is often caused by overuse and repetitive microtrauma. SYMPTOMS  Pain, tenderness, swelling, warmth, and redness may occur over the Achilles tendon even at rest.  Pain with pushing off, or flexing or extending the ankle.  Pain that is worsened after or during activity. CAUSES   Overuse sometimes seen with rapid increase in exercise programs or in sports requiring running and jumping.  Poor physical conditioning (strength and flexibility or endurance).  Running sports, especially training running down hills.  Inadequate warm-up before practice or play or failure to stretch before participation.  Injury to the tendon. PREVENTION   Warm up and stretch before practice or competition.  Allow time for adequate rest and recovery between practices and competition.  Keep up conditioning.  Keep up ankle and leg flexibility.  Improve or keep muscle strength and endurance.  Improve cardiovascular fitness.  Use proper technique.  Use proper equipment (shoes, skates).  To help prevent recurrence, taping, protective strapping, or an adhesive bandage may be recommended for several weeks after healing is complete. PROGNOSIS   Recovery may take weeks to several months to heal.  Longer recovery is expected if symptoms have been prolonged.  Recovery is usually quicker if the inflammation is due to a direct blow as compared with overuse or sudden strain. RELATED COMPLICATIONS   Healing time will be prolonged if the condition is not correctly treated. The injury must be given plenty of time to heal.  Symptoms can reoccur if  activity is resumed too soon.  Untreated, tendinitis may increase the risk of tendon rupture requiring additional time for recovery and possibly surgery. TREATMENT   The first treatment consists of rest anti-inflammatory medication, and ice to relieve the pain.  Stretching and strengthening exercises after resolution of pain will likely help reduce the risk of recurrence. Referral to a physical therapist or athletic trainer for further evaluation and treatment may be helpful.  A walking boot or cast may be recommended to rest the Achilles tendon. This can help break the cycle of inflammation and microtrauma.  Arch supports (orthotics) may be prescribed or recommended by your caregiver as an adjunct to therapy and rest.  Surgery to remove the inflamed tendon lining or degenerated tendon tissue is rarely necessary and has shown less than predictable results. MEDICATION   Nonsteroidal anti-inflammatory medications, such as aspirin and ibuprofen, may be used for pain and inflammation relief. Do not take within 7 days before surgery. Take these as directed by your caregiver. Contact your caregiver immediately if any bleeding, stomach upset, or signs of allergic reaction occur. Other minor pain relievers, such as acetaminophen, may also be used.  Pain relievers may be prescribed as necessary by your caregiver. Do not take prescription pain medication for longer than 4 to 7 days. Use only as directed and only as much as you need.  Cortisone injections are rarely indicated. Cortisone injections may weaken tendons and predispose to rupture. It is better to give the condition more time to heal than to use them. HEAT AND COLD  Cold is used to relieve pain and reduce inflammation for acute and chronic Achilles tendinitis. Cold should be applied for 10 to 15 minutes   every 2 to 3 hours for inflammation and pain and immediately after any activity that aggravates your symptoms. Use ice packs or an ice  massage.  Heat may be used before performing stretching and strengthening activities prescribed by your caregiver. Use a heat pack or a warm soak. SEEK MEDICAL CARE IF:  Symptoms get worse or do not improve in 2 weeks despite treatment.  New, unexplained symptoms develop. Drugs used in treatment may produce side effects.  EXERCISES:  RANGE OF MOTION (ROM) AND STRETCHING EXERCISES - Achilles Tendinitis  These exercises may help you when beginning to rehabilitate your injury. Your symptoms may resolve with or without further involvement from your physician, physical therapist or athletic trainer. While completing these exercises, remember:   Restoring tissue flexibility helps normal motion to return to the joints. This allows healthier, less painful movement and activity.  An effective stretch should be held for at least 30 seconds.  A stretch should never be painful. You should only feel a gentle lengthening or release in the stretched tissue.  STRETCH  Gastroc, Standing   Place hands on wall.  Extend right / left leg, keeping the front knee somewhat bent.  Slightly point your toes inward on your back foot.  Keeping your right / left heel on the floor and your knee straight, shift your weight toward the wall, not allowing your back to arch.  You should feel a gentle stretch in the right / left calf. Hold this position for 10 seconds. Repeat 3 times. Complete this stretch 2 times per day.  STRETCH  Soleus, Standing   Place hands on wall.  Extend right / left leg, keeping the other knee somewhat bent.  Slightly point your toes inward on your back foot.  Keep your right / left heel on the floor, bend your back knee, and slightly shift your weight over the back leg so that you feel a gentle stretch deep in your back calf.  Hold this position for 10 seconds. Repeat 3 times. Complete this stretch 2 times per day.  STRETCH  Gastrocsoleus, Standing  Note: This exercise can place  a lot of stress on your foot and ankle. Please complete this exercise only if specifically instructed by your caregiver.   Place the ball of your right / left foot on a step, keeping your other foot firmly on the same step.  Hold on to the wall or a rail for balance.  Slowly lift your other foot, allowing your body weight to press your heel down over the edge of the step.  You should feel a stretch in your right / left calf.  Hold this position for 10 seconds.  Repeat this exercise with a slight bend in your knee. Repeat 3 times. Complete this stretch 2 times per day.   STRENGTHENING EXERCISES - Achilles Tendinitis These exercises may help you when beginning to rehabilitate your injury. They may resolve your symptoms with or without further involvement from your physician, physical therapist or athletic trainer. While completing these exercises, remember:   Muscles can gain both the endurance and the strength needed for everyday activities through controlled exercises.  Complete these exercises as instructed by your physician, physical therapist or athletic trainer. Progress the resistance and repetitions only as guided.  You may experience muscle soreness or fatigue, but the pain or discomfort you are trying to eliminate should never worsen during these exercises. If this pain does worsen, stop and make certain you are following the directions exactly. If   the pain is still present after adjustments, discontinue the exercise until you can discuss the trouble with your clinician.  STRENGTH - Plantar-flexors   Sit with your right / left leg extended. Holding onto both ends of a rubber exercise band/tubing, loop it around the ball of your foot. Keep a slight tension in the band.  Slowly push your toes away from you, pointing them downward.  Hold this position for 10 seconds. Return slowly, controlling the tension in the band/tubing. Repeat 3 times. Complete this exercise 2 times per day.     STRENGTH - Plantar-flexors   Stand with your feet shoulder width apart. Steady yourself with a wall or table using as little support as needed.  Keeping your weight evenly spread over the width of your feet, rise up on your toes.*  Hold this position for 10 seconds. Repeat 3 times. Complete this exercise 2 times per day.  *If this is too easy, shift your weight toward your right / left leg until you feel challenged. Ultimately, you may be asked to do this exercise with your right / left foot only.  STRENGTH  Plantar-flexors, Eccentric  Note: This exercise can place a lot of stress on your foot and ankle. Please complete this exercise only if specifically instructed by your caregiver.   Place the balls of your feet on a step. With your hands, use only enough support from a wall or rail to keep your balance.  Keep your knees straight and rise up on your toes.  Slowly shift your weight entirely to your right / left toes and pick up your opposite foot. Gently and with controlled movement, lower your weight through your right / left foot so that your heel drops below the level of the step. You will feel a slight stretch in the back of your calf at the end position.  Use the healthy leg to help rise up onto the balls of both feet, then lower weight only on the right / left leg again. Build up to 15 repetitions. Then progress to 3 consecutive sets of 15 repetitions.*  After completing the above exercise, complete the same exercise with a slight knee bend (about 30 degrees). Again, build up to 15 repetitions. Then progress to 3 consecutive sets of 15 repetitions.* Perform this exercise 2 times per day.  *When you easily complete 3 sets of 15, your physician, physical therapist or athletic trainer may advise you to add resistance by wearing a backpack filled with additional weight.  STRENGTH - Plantar Flexors, Seated   Sit on a chair that allows your feet to rest flat on the ground. If  necessary, sit at the edge of the chair.  Keeping your toes firmly on the ground, lift your right / left heel as far as you can without increasing any discomfort in your ankle. Repeat 3 times. Complete this exercise 2 times a day.    Plantar Fasciitis (Heel Spur Syndrome) with Rehab The plantar fascia is a fibrous, ligament-like, soft-tissue structure that spans the bottom of the foot. Plantar fasciitis is a condition that causes pain in the foot due to inflammation of the tissue. SYMPTOMS   Pain and tenderness on the underneath side of the foot.  Pain that worsens with standing or walking. CAUSES  Plantar fasciitis is caused by irritation and injury to the plantar fascia on the underneath side of the foot. Common mechanisms of injury include:  Direct trauma to bottom of the foot.  Damage to a small   nerve that runs under the foot where the main fascia attaches to the heel bone.  Stress placed on the plantar fascia due to bone spurs. RISK INCREASES WITH:   Activities that place stress on the plantar fascia (running, jumping, pivoting, or cutting).  Poor strength and flexibility.  Improperly fitted shoes.  Tight calf muscles.  Flat feet.  Failure to warm-up properly before activity.  Obesity. PREVENTION  Warm up and stretch properly before activity.  Allow for adequate recovery between workouts.  Maintain physical fitness:  Strength, flexibility, and endurance.  Cardiovascular fitness.  Maintain a health body weight.  Avoid stress on the plantar fascia.  Wear properly fitted shoes, including arch supports for individuals who have flat feet.  PROGNOSIS  If treated properly, then the symptoms of plantar fasciitis usually resolve without surgery. However, occasionally surgery is necessary.  RELATED COMPLICATIONS   Recurrent symptoms that may result in a chronic condition.  Problems of the lower back that are caused by compensating for the injury, such as  limping.  Pain or weakness of the foot during push-off following surgery.  Chronic inflammation, scarring, and partial or complete fascia tear, occurring more often from repeated injections.  TREATMENT  Treatment initially involves the use of ice and medication to help reduce pain and inflammation. The use of strengthening and stretching exercises may help reduce pain with activity, especially stretches of the Achilles tendon. These exercises may be performed at home or with a therapist. Your caregiver may recommend that you use heel cups of arch supports to help reduce stress on the plantar fascia. Occasionally, corticosteroid injections are given to reduce inflammation. If symptoms persist for greater than 6 months despite non-surgical (conservative), then surgery may be recommended.   MEDICATION   If pain medication is necessary, then nonsteroidal anti-inflammatory medications, such as aspirin and ibuprofen, or other minor pain relievers, such as acetaminophen, are often recommended.  Do not take pain medication within 7 days before surgery.  Prescription pain relievers may be given if deemed necessary by your caregiver. Use only as directed and only as much as you need.  Corticosteroid injections may be given by your caregiver. These injections should be reserved for the most serious cases, because they may only be given a certain number of times.  HEAT AND COLD  Cold treatment (icing) relieves pain and reduces inflammation. Cold treatment should be applied for 10 to 15 minutes every 2 to 3 hours for inflammation and pain and immediately after any activity that aggravates your symptoms. Use ice packs or massage the area with a piece of ice (ice massage).  Heat treatment may be used prior to performing the stretching and strengthening activities prescribed by your caregiver, physical therapist, or athletic trainer. Use a heat pack or soak the injury in warm water.  SEEK IMMEDIATE MEDICAL  CARE IF:  Treatment seems to offer no benefit, or the condition worsens.  Any medications produce adverse side effects.  EXERCISES- RANGE OF MOTION (ROM) AND STRETCHING EXERCISES - Plantar Fasciitis (Heel Spur Syndrome) These exercises may help you when beginning to rehabilitate your injury. Your symptoms may resolve with or without further involvement from your physician, physical therapist or athletic trainer. While completing these exercises, remember:   Restoring tissue flexibility helps normal motion to return to the joints. This allows healthier, less painful movement and activity.  An effective stretch should be held for at least 30 seconds.  A stretch should never be painful. You should only feel a gentle lengthening   or release in the stretched tissue.  RANGE OF MOTION - Toe Extension, Flexion  Sit with your right / left leg crossed over your opposite knee.  Grasp your toes and gently pull them back toward the top of your foot. You should feel a stretch on the bottom of your toes and/or foot.  Hold this stretch for 10 seconds.  Now, gently pull your toes toward the bottom of your foot. You should feel a stretch on the top of your toes and or foot.  Hold this stretch for 10 seconds. Repeat  times. Complete this stretch 3 times per day.   RANGE OF MOTION - Ankle Dorsiflexion, Active Assisted  Remove shoes and sit on a chair that is preferably not on a carpeted surface.  Place right / left foot under knee. Extend your opposite leg for support.  Keeping your heel down, slide your right / left foot back toward the chair until you feel a stretch at your ankle or calf. If you do not feel a stretch, slide your bottom forward to the edge of the chair, while still keeping your heel down.  Hold this stretch for 10 seconds. Repeat 3 times. Complete this stretch 2 times per day.   STRETCH  Gastroc, Standing  Place hands on wall.  Extend right / left leg, keeping the front knee  somewhat bent.  Slightly point your toes inward on your back foot.  Keeping your right / left heel on the floor and your knee straight, shift your weight toward the wall, not allowing your back to arch.  You should feel a gentle stretch in the right / left calf. Hold this position for 10 seconds. Repeat 3 times. Complete this stretch 2 times per day.  STRETCH  Soleus, Standing  Place hands on wall.  Extend right / left leg, keeping the other knee somewhat bent.  Slightly point your toes inward on your back foot.  Keep your right / left heel on the floor, bend your back knee, and slightly shift your weight over the back leg so that you feel a gentle stretch deep in your back calf.  Hold this position for 10 seconds. Repeat 3 times. Complete this stretch 2 times per day.  STRETCH  Gastrocsoleus, Standing  Note: This exercise can place a lot of stress on your foot and ankle. Please complete this exercise only if specifically instructed by your caregiver.   Place the ball of your right / left foot on a step, keeping your other foot firmly on the same step.  Hold on to the wall or a rail for balance.  Slowly lift your other foot, allowing your body weight to press your heel down over the edge of the step.  You should feel a stretch in your right / left calf.  Hold this position for 10 seconds.  Repeat this exercise with a slight bend in your right / left knee. Repeat 3 times. Complete this stretch 2 times per day.   STRENGTHENING EXERCISES - Plantar Fasciitis (Heel Spur Syndrome)  These exercises may help you when beginning to rehabilitate your injury. They may resolve your symptoms with or without further involvement from your physician, physical therapist or athletic trainer. While completing these exercises, remember:   Muscles can gain both the endurance and the strength needed for everyday activities through controlled exercises.  Complete these exercises as instructed by  your physician, physical therapist or athletic trainer. Progress the resistance and repetitions only as guided.  STRENGTH -   Towel Curls  Sit in a chair positioned on a non-carpeted surface.  Place your foot on a towel, keeping your heel on the floor.  Pull the towel toward your heel by only curling your toes. Keep your heel on the floor. Repeat 3 times. Complete this exercise 2 times per day.  STRENGTH - Ankle Inversion  Secure one end of a rubber exercise band/tubing to a fixed object (table, pole). Loop the other end around your foot just before your toes.  Place your fists between your knees. This will focus your strengthening at your ankle.  Slowly, pull your big toe up and in, making sure the band/tubing is positioned to resist the entire motion.  Hold this position for 10 seconds.  Have your muscles resist the band/tubing as it slowly pulls your foot back to the starting position. Repeat 3 times. Complete this exercises 2 times per day.  Document Released: 10/04/2005 Document Revised: 12/27/2011 Document Reviewed: 01/16/2009 [employer] Patient Information 2014 [employer]  

## 2014-03-14 NOTE — Progress Notes (Signed)
Subjective:     Patient ID: Kara Haas, female   DOB: [birth year], 43 y.o.   MRN: [id number]  HPI patient presents stating that she's had a lot of pain in her right foot and ankle for approximately the last month. Feels that it started in her Achilles tendon went to the bottom of her heel and then to the outside of her foot with intense pain with activity. Patient does have fibromyalgia and feels that when she does get injuries that things can hurt more than other people   Review of Systems  All other systems reviewed and are negative.      Objective:   Physical Exam  Nursing note and vitals reviewed. Constitutional: She is oriented to person, place, and time.  Cardiovascular: Intact distal pulses.   Musculoskeletal: Normal range of motion.  Neurological: She is oriented to person, place, and time.  Skin: Skin is warm.   neurovascular status intact with muscle strength adequate in range of motion diminished on the right side as she is splinting for the pain that she is experiencing. No equinus condition was noted and there is discomfort at the insertion of the Achilles on the lateral side right heel and discomfort on the proximal portion of the plantar fascia right and discomfort in the sinus tarsi that is quite intense when pressed. Digits are well perfused and arch height is normal     Assessment:     Numerous different conditions with the proximal plantar fasciitis in the sinus tarsitis bothering her the most    Plan:     H&P and x-rays reviewed. Injected the proximal plantar fascia right 3 mg Kenalog 5 mg Xylocaine Marcaine mixture and the sinus tarsi right 3 mg Kenalog 5 mg Xylocaine Marcaine mixture and dispensed air fracture walker with all instructions on usage to reduce all strain on the foot and ankle and Achilles tendon. Also placed on diclofenac 75 mg twice a day

## 2014-03-28 ENCOUNTER — Ambulatory Visit (INDEPENDENT_AMBULATORY_CARE_PROVIDER_SITE_OTHER): Payer: BC Managed Care – PPO | Admitting: Podiatry

## 2014-03-28 ENCOUNTER — Encounter: Payer: Self-pay | Admitting: Podiatry

## 2014-03-28 VITALS — BP 126/80 | HR 60 | Resp 12

## 2014-03-28 DIAGNOSIS — M779 Enthesopathy, unspecified: Secondary | ICD-10-CM

## 2014-03-28 MED ORDER — TRIAMCINOLONE ACETONIDE 10 MG/ML IJ SUSP
10.0000 mg | Freq: Once | INTRAMUSCULAR | Status: AC
  Administered 2014-03-28: 10 mg

## 2014-04-01 NOTE — Progress Notes (Signed)
Subjective:     Patient ID: Laretta BolsterJulia D Justiss, female   DOB: 05/13/1971, 43 y.o.   MRN: [id number]  HPI patient presents stating my foot is feeling a lot better with mild discomfort upon palpation   Review of Systems     Objective:   Physical Exam Neurovascular status intact with no health history changes with continued strain to the foot which has been relatively chronic in nature    Assessment:     Tendinitis with structural issues noted that are part of the pathological process    Plan:

## 2014-04-12 ENCOUNTER — Encounter: Payer: Self-pay | Admitting: Podiatry

## 2014-04-12 ENCOUNTER — Ambulatory Visit (INDEPENDENT_AMBULATORY_CARE_PROVIDER_SITE_OTHER): Payer: BC Managed Care – PPO | Admitting: Podiatry

## 2014-04-12 DIAGNOSIS — M779 Enthesopathy, unspecified: Secondary | ICD-10-CM

## 2014-04-12 MED ORDER — TRIAMCINOLONE ACETONIDE 10 MG/ML IJ SUSP
10.0000 mg | Freq: Once | INTRAMUSCULAR | Status: AC
  Administered 2014-04-12: 10 mg

## 2014-04-13 NOTE — Progress Notes (Signed)
Subjective:     Patient ID: Kara BolsterJulia D Clayburn, female   DOB: 07/06/1971, 43 y.o.   MRN: [id number]  HPI patient states my right foot is doing well but there still one small area that bothers me with ambulation   Review of Systems     Objective:   Physical Exam Neurovascular status intact muscle strength adequate and I noted that the pain has receded quite a bit with one area that still tender in the medial fascial band    Assessment:     Plantar fasciitis improving quite a bit    Plan:     Reinjected the plantar fascia at the one area of irritation 3 mg Kenalog 5 mg I can Marcaine mixture and advised him reduced activity and reappoint as needed

## 2014-06-25 ENCOUNTER — Ambulatory Visit (INDEPENDENT_AMBULATORY_CARE_PROVIDER_SITE_OTHER): Payer: BC Managed Care – PPO | Admitting: Family Medicine

## 2014-06-25 ENCOUNTER — Encounter: Payer: Self-pay | Admitting: Family Medicine

## 2014-06-25 VITALS — BP 104/70 | HR 67 | Temp 98.4°F | Ht 65.5 in | Wt 151.8 lb

## 2014-06-25 DIAGNOSIS — M25549 Pain in joints of unspecified hand: Secondary | ICD-10-CM

## 2014-06-25 DIAGNOSIS — M79674 Pain in right toe(s): Secondary | ICD-10-CM

## 2014-06-25 DIAGNOSIS — M79609 Pain in unspecified limb: Secondary | ICD-10-CM

## 2014-06-25 DIAGNOSIS — M25541 Pain in joints of right hand: Secondary | ICD-10-CM

## 2014-06-25 LAB — URIC ACID: Uric Acid, Serum: 4 mg/dL (ref 2.4–7.0)

## 2014-06-25 NOTE — Progress Notes (Signed)
   Subjective:    Patient ID: Kara Haas, female    DOB: June 01, 1971, 43 y.o.   MRN: [id number]  HPI 43 year old female pt presents to clinic with new onset pain in 5th  toe  On right footand fingers on right hand.  Pain is in DIPs.  Pain started in last 24 hours She has not tried any medication other than advil 400 mg. Helps some.  She is worried she may have gout.  She has had issues with achilles tendon in 02/2014.. Saw [facility], Dr. Leary Roca. Given steroid injections and meloxicam. At that time she also had mild soreness in left fingers. All this is resolved now.   She has been told she had gout in great toe few years ago.  She has been on a all bean diet. Review of Systems  Constitutional: Negative for fever and fatigue.  HENT: Negative for ear pain.   Eyes: Negative for pain.  Respiratory: Negative for chest tightness and shortness of breath.   Cardiovascular: Negative for chest pain, palpitations and leg swelling.  Gastrointestinal: Negative for abdominal pain.  Genitourinary: Negative for dysuria.       Objective:   Physical Exam  Constitutional: Vital signs are normal. She appears well-developed and well-nourished. She is cooperative.  Non-toxic appearance. She does not appear ill. No distress.  HENT:  Head: Normocephalic.  Right Ear: Hearing, tympanic membrane, external ear and ear canal normal. Tympanic membrane is not erythematous, not retracted and not bulging.  Left Ear: Hearing, tympanic membrane, external ear and ear canal normal. Tympanic membrane is not erythematous, not retracted and not bulging.  Nose: No mucosal edema or rhinorrhea. Right sinus exhibits no maxillary sinus tenderness and no frontal sinus tenderness. Left sinus exhibits no maxillary sinus tenderness and no frontal sinus tenderness.  Mouth/Throat: Uvula is midline, oropharynx is clear and moist and mucous membranes are normal.  Eyes: Conjunctivae, EOM and lids are normal. Pupils are  equal, round, and reactive to light. Lids are everted and swept, no foreign bodies found.  Neck: Trachea normal and normal range of motion. Neck supple. Carotid bruit is not present. No mass and no thyromegaly present.  Cardiovascular: Normal rate, regular rhythm, S1 normal, S2 normal, normal heart sounds, intact distal pulses and normal pulses.  Exam reveals no gallop and no friction rub.   No murmur heard. Pulmonary/Chest: Effort normal and breath sounds normal. Not tachypneic. No respiratory distress. She has no decreased breath sounds. She has no wheezes. She has no rhonchi. She has no rales.  Abdominal: Soft. Normal appearance and bowel sounds are normal. There is no tenderness.  Musculoskeletal:  Small nodules and ttp in right DIP joints and right 5th digit. Decreased ROM  no erythema, no swelling  Neurological: She is alert.  Skin: Skin is warm, dry and intact. No rash noted.  Psychiatric: Her speech is normal and behavior is normal. Judgment and thought content normal. Her mood appears not anxious. Cognition and memory are normal. She does not exhibit a depressed mood.          Assessment & Plan:  Arthralgia multiple sites: Eval uric acis but exam not specific to this.  Most liekly arthritis flare. Start meloxicam daily.  Consider RA work up if not improving as expected or more typical joints involved.

## 2014-06-25 NOTE — Progress Notes (Signed)
Pre visit review using our clinic review tool, if applicable. No additional management support is needed unless otherwise documented below in the visit note. 

## 2014-06-25 NOTE — Patient Instructions (Addendum)
Stop by lab on way out for uric acid test. Start meloxicam daily x 1-2 weeks. Call or send note via MyChart if not improving as expected.

## 2014-06-25 NOTE — Addendum Note (Signed)
Addended by: Alvina Chou on: 06/25/2014 10:05 AM   Modules accepted: Orders

## 2014-06-25 NOTE — Addendum Note (Signed)
Addended by: Damita Lack on: 06/25/2014 10:01 AM   Modules accepted: Orders

## 2014-06-26 ENCOUNTER — Telehealth: Payer: Self-pay

## 2014-06-26 NOTE — Telephone Encounter (Signed)
pt left v/m requesting cb with 06/25/14 lab results.Please advise.

## 2014-07-18 ENCOUNTER — Encounter: Payer: Self-pay | Admitting: Gastroenterology

## 2014-08-14 ENCOUNTER — Other Ambulatory Visit: Payer: Self-pay | Admitting: Family Medicine

## 2014-09-13 ENCOUNTER — Other Ambulatory Visit: Payer: Self-pay | Admitting: Family Medicine

## 2014-09-26 ENCOUNTER — Other Ambulatory Visit: Payer: Self-pay | Admitting: Obstetrics and Gynecology

## 2014-09-26 DIAGNOSIS — N63 Unspecified lump in unspecified breast: Secondary | ICD-10-CM

## 2014-11-01 ENCOUNTER — Ambulatory Visit
Admission: RE | Admit: 2014-11-01 | Discharge: 2014-11-01 | Disposition: A | Discharge status: Home / Self Care | Payer: BLUE CROSS/BLUE SHIELD | Source: Ambulatory Visit | Attending: Obstetrics and Gynecology | Admitting: Obstetrics and Gynecology

## 2014-11-01 ENCOUNTER — Other Ambulatory Visit: Payer: Self-pay | Admitting: Obstetrics and Gynecology

## 2014-11-01 DIAGNOSIS — N63 Unspecified lump in unspecified breast: Secondary | ICD-10-CM

## 2014-11-22 ENCOUNTER — Other Ambulatory Visit: Payer: Self-pay | Admitting: Family Medicine

## 2015-03-05 ENCOUNTER — Telehealth: Payer: Self-pay | Admitting: Family Medicine

## 2015-03-05 NOTE — Telephone Encounter (Signed)
Patient called and said she has a cyst on her hairline.  She said the skin is smooth,no rash,just a hard knot under the skin.  Patient wants to know if she needs to be seen or referred to someone.

## 2015-03-06 NOTE — Telephone Encounter (Signed)
If non painful, no redness and not changing significantly in size she does not need to do anything about it. IF it is any of these things she can make an appt with me. If she simply wants it removed for cosmetic reasons... Recommend seeing derm.

## 2015-03-06 NOTE — Telephone Encounter (Signed)
Ms. Kara Haas notified as instructed by telephone.  She will call her dermatologist and schedule an appointment with them because it has increased in size.

## 2015-04-15 ENCOUNTER — Telehealth: Payer: Self-pay | Admitting: Family Medicine

## 2015-04-15 DIAGNOSIS — M6740 Ganglion, unspecified site: Secondary | ICD-10-CM

## 2015-04-15 NOTE — Telephone Encounter (Signed)
Referral sent 

## 2015-04-15 NOTE — Telephone Encounter (Signed)
Pt needs referral for hand surgeon to remove ganglion cyst on wrist.  The dermatologist was able to remove one cyst, but could not remove the second one.  He told the pt to go to a hand surgeon to remove the second one.  Pt number is [phone], thanks.

## 2015-04-15 NOTE — Telephone Encounter (Signed)
Kara AmenJulia notified referral has been ordered and Kara Haas or Kara Haas will be calling her with that appointment once they get it scheduled.

## 2015-08-09 ENCOUNTER — Other Ambulatory Visit: Payer: Self-pay | Admitting: Family Medicine

## 2015-09-05 ENCOUNTER — Ambulatory Visit (INDEPENDENT_AMBULATORY_CARE_PROVIDER_SITE_OTHER)
Admission: RE | Admit: 2015-09-05 | Discharge: 2015-09-05 | Disposition: A | Discharge status: Home / Self Care | Payer: BLUE CROSS/BLUE SHIELD | Source: Ambulatory Visit | Attending: Family Medicine | Admitting: Family Medicine

## 2015-09-05 ENCOUNTER — Ambulatory Visit (INDEPENDENT_AMBULATORY_CARE_PROVIDER_SITE_OTHER): Payer: BLUE CROSS/BLUE SHIELD | Admitting: Family Medicine

## 2015-09-05 ENCOUNTER — Encounter: Payer: Self-pay | Admitting: Family Medicine

## 2015-09-05 VITALS — BP 96/64 | HR 77 | Temp 98.7°F | Ht 65.5 in | Wt 144.5 lb

## 2015-09-05 DIAGNOSIS — M79671 Pain in right foot: Secondary | ICD-10-CM | POA: Diagnosis not present

## 2015-09-05 DIAGNOSIS — J3089 Other allergic rhinitis: Secondary | ICD-10-CM

## 2015-09-05 DIAGNOSIS — Z23 Encounter for immunization: Secondary | ICD-10-CM

## 2015-09-05 MED ORDER — FLUTICASONE PROPIONATE 50 MCG/ACT NA SUSP: NASAL | Status: DC

## 2015-09-05 NOTE — Assessment & Plan Note (Signed)
Possible ligament injury chronic versus stress fracture versus arthritis.  Treat with ice, elevation, eval with X-ray.  Given ankle foot rehab exercises.  May need referral  Back to podiatry.

## 2015-09-05 NOTE — Progress Notes (Signed)
Pre visit review using our clinic review tool, if applicable. No additional management support is needed unless otherwise documented below in the visit note. 

## 2015-09-05 NOTE — Assessment & Plan Note (Signed)
Stable control on flonase.Marland Kitchen. Refilled.

## 2015-09-05 NOTE — Patient Instructions (Signed)
Stop at X-ray .Marland Kitchen. We will call with results.  Ice, elevate and start foot/ankle rehab.

## 2015-09-05 NOTE — Progress Notes (Addendum)
   Subjective:    Patient ID: Kara BolsterJulia D Eagleson, female    DOB: 10/07/1971, 44 y.o.   MRN: [id number]  HPI   44 year old female patient presents with several concerns.  1. Allergies rhinitis:  Well controlled with zyrtec, uses decongestant prn.  Needs refill of flonase.  2. Foot pain,chronic  Onset: She has continued having upper anterior foot in last year. No t worsening.  Occ burning stinging aching and occ soreness on feet a lot., treadmill. No known injury, no fall.  Hx of achilles tendonitis last year.  Treated at [facility], Linard [facility] [place].  Had the pain at that time.  3. Needs flu shot.      Review of Systems  Constitutional: Negative for fever and fatigue.  HENT: Negative for ear pain.   Eyes: Negative for pain.  Respiratory: Negative for chest tightness and shortness of breath.   Cardiovascular: Negative for chest pain, palpitations and leg swelling.  Gastrointestinal: Negative for abdominal pain.  Genitourinary: Negative for dysuria.       Objective:   Physical Exam  Constitutional: Vital signs are normal. She appears well-developed and well-nourished. She is cooperative.  Non-toxic appearance. She does not appear ill. No distress.  HENT:  Head: Normocephalic.  Right Ear: Hearing, tympanic membrane, external ear and ear canal normal. Tympanic membrane is not erythematous, not retracted and not bulging.  Left Ear: Hearing, tympanic membrane, external ear and ear canal normal. Tympanic membrane is not erythematous, not retracted and not bulging.  Nose: No mucosal edema or rhinorrhea. Right sinus exhibits no maxillary sinus tenderness and no frontal sinus tenderness. Left sinus exhibits no maxillary sinus tenderness and no frontal sinus tenderness.  Mouth/Throat: Uvula is midline, oropharynx is clear and moist and mucous membranes are normal.  Eyes: Conjunctivae, EOM and lids are normal. Pupils are equal, round, and reactive to light. Lids are everted and swept, no  foreign bodies found.  Neck: Trachea normal and normal range of motion. Neck supple. Carotid bruit is not present. No thyroid mass and no thyromegaly present.  Cardiovascular: Normal rate, regular rhythm, S1 normal, S2 normal, normal heart sounds, intact distal pulses and normal pulses.  Exam reveals no gallop and no friction rub.   No murmur heard. Pulmonary/Chest: Effort normal and breath sounds normal. No tachypnea. No respiratory distress. She has no decreased breath sounds. She has no wheezes. She has no rhonchi. She has no rales.  Abdominal: Soft. Normal appearance and bowel sounds are normal. There is no tenderness.  Musculoskeletal:       Right ankle: She exhibits normal range of motion and no ecchymosis. Tenderness. Lateral malleolus tenderness found. No medial malleolus tenderness found. Achilles tendon normal. Achilles tendon exhibits no pain, no defect and normal Thompson's test results.       Right foot: There is tenderness. There is normal range of motion and no bony tenderness.       Feet:  Neurological: She is alert.  Skin: Skin is warm, dry and intact. No rash noted.  Psychiatric: Her speech is normal and behavior is normal. Judgment and thought content normal. Her mood appears not anxious. Cognition and memory are normal. She does not exhibit a depressed mood.          Assessment & Plan:

## 2015-11-05 ENCOUNTER — Other Ambulatory Visit: Payer: Self-pay

## 2015-11-06 ENCOUNTER — Other Ambulatory Visit: Payer: Self-pay | Admitting: *Deleted

## 2015-11-06 MED ORDER — FLUTICASONE PROPIONATE 50 MCG/ACT NA SUSP: NASAL | Status: DC

## 2015-11-18 ENCOUNTER — Other Ambulatory Visit: Payer: Self-pay

## 2015-11-18 DIAGNOSIS — Z1231 Encounter for screening mammogram for malignant neoplasm of breast: Secondary | ICD-10-CM

## 2015-12-05 ENCOUNTER — Ambulatory Visit
Admission: RE | Admit: 2015-12-05 | Discharge: 2015-12-05 | Disposition: A | Discharge status: Home / Self Care | Payer: No Typology Code available for payment source | Source: Ambulatory Visit

## 2015-12-05 DIAGNOSIS — Z1231 Encounter for screening mammogram for malignant neoplasm of breast: Secondary | ICD-10-CM

## 2016-01-05 ENCOUNTER — Telehealth: Payer: Self-pay

## 2016-01-05 ENCOUNTER — Ambulatory Visit (INDEPENDENT_AMBULATORY_CARE_PROVIDER_SITE_OTHER): Payer: No Typology Code available for payment source | Admitting: Primary Care

## 2016-01-05 ENCOUNTER — Encounter: Payer: Self-pay | Admitting: Primary Care

## 2016-01-05 VITALS — BP 118/82 | HR 69 | Temp 98.3°F | Ht 65.5 in | Wt 146.8 lb

## 2016-01-05 DIAGNOSIS — M792 Neuralgia and neuritis, unspecified: Secondary | ICD-10-CM | POA: Diagnosis not present

## 2016-01-05 MED ORDER — GABAPENTIN 300 MG PO CAPS: ORAL_CAPSULE | ORAL | Status: DC

## 2016-01-05 NOTE — Progress Notes (Signed)
Pre visit review using our clinic review tool, if applicable. No additional management support is needed unless otherwise documented below in the visit note. 

## 2016-01-05 NOTE — Patient Instructions (Signed)
Start Gabapentin for presumed nerve pain. Take 1 capsule by mouth on day 1, then 1 capsule by mouth twice daily on day 2, then 1 capsule by mouth three times daily thereafter.   Please notify me if no improvement. Please notify me if you develop a rash to the affected area.   It was a pleasure meeting you!

## 2016-01-05 NOTE — Progress Notes (Signed)
Subjective:    Patient ID: Kara Haas, female    DOB: 10/09/1971, 46 y.o.   MRN: [id number]  HPI  Ms. Kara Haas is a 45 year old female who presents today with a chief complaint of side pain. Her pain is located to the left lateral side just under her left arm near the bra line. Her pain radiates over to her LUQ to the epigsatric region. She describes her pain as intermittent pins and needles. She feels as though her skin is on fire/feels hot. Her symptoms have been present since Friday but was much less intense.  Movement causes her pain to become worse.   Denies rash. She's been applying lidocaine lotion with improvement as her clothing causes pain. She felt feverish, Denies body aches, cough, sore throat.  Review of Systems  Constitutional: Negative for fever.  Skin: Negative for rash.  Neurological: Positive for numbness.       Nerve pain       Past Medical History  Diagnosis Date  . Fracture of coccyx (HCC)     H/O distantly  . GERD (gastroesophageal reflux disease)   . IBS (irritable bowel syndrome)   . Anxiety disorder   . Depression   . Gout   . Allergic rhinitis     Social History   Social History  . Marital Status: Single    Spouse Name: N/A  . Number of Children: N/A  . Years of Education: N/A   Occupational History  . Gaffer    Social History Main Topics  . Smoking status: Former Smoker    Types: Cigarettes  . Smokeless tobacco: Never Used     Comment: 2 cigarettes daily  . Alcohol Use: 0.0 oz/week    0 Standard drinks or equivalent per week     Comment: wine 2-3 glasses a night in past, but now 1 glass per night  . Drug Use: No  . Sexual Activity: Not on file   Other Topics Concern  . Not on file   Social History Narrative   Divorced   Daily caffeine- Use 1 coffee/day   Patient gets regular exercise    Past Surgical History  Procedure Laterality Date  . Breast enhancement surgery    . Tubal ligation  1998    Family  History  Problem Relation Age of Onset  . Cancer Father     prostate  . Cancer Maternal Grandmother 74    colon  . Diabetes Other     both sides of family    Allergies  Allergen Reactions  . Sulfonamide Derivatives     REACTION: Rash as a teenager    Current Outpatient Prescriptions on File Prior to Visit  Medication Sig Dispense Refill  . cetirizine (ZYRTEC) 10 MG tablet Take 10 mg by mouth daily.    . Cholecalciferol (VITAMIN D PO) Take 1 tablet by mouth daily.    . fluticasone (FLONASE) 50 MCG/ACT nasal spray PLACE 2 SPRAYS INTO BOTH NOSTRILS DAILY. 48 g 3  . Glucosamine HCl (GLUCOSAMINE PO) Take 1 tablet by mouth daily.    Marland Kitchen ibuprofen (ADVIL,MOTRIN) 200 MG tablet Take 200 mg by mouth every 6 (six) hours as needed.     No current facility-administered medications on file prior to visit.    BP 118/82 mmHg  Pulse 69  Temp(Src) 98.3 F (36.8 C) (Oral)  Ht 5' 5.5" (1.664 m)  Wt 146 lb 12.8 oz (66.588 kg)  BMI 24.05 kg/m2  SpO2 99%  LMP 12/15/2015    Objective:   Physical Exam  Constitutional: She appears well-nourished.  Cardiovascular: Normal rate and regular rhythm.   Pulmonary/Chest: Effort normal and breath sounds normal.  Abdominal: Soft.  Skin: Skin is warm and dry. No rash noted.  Tenderness to left lateral chest wall to bra line mostly. No rash, vesicles, erythema.           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 Worse yesterday. Feels like pins and needles, feels as though skin is on fire. Exam without evidence of vesicles, rash, erythema. Tender. Will treat neuropathic pain with Gabapentin course and have her keep a close eye on skin for rash as symptoms have been present for 3 days.  Strict return precautions provided.

## 2016-01-05 NOTE — Telephone Encounter (Signed)
Pt said for couple of days had pain in lt side under arm at bra line; couple of days ago pt had diarrhea; no diarrhea now.pt said where having lt side pain also has feeling of hot sensation in side; pt wonders if she might have shingles but has not broken out with rash.pain level now is 3. No known injury. Lidocaine cream helps pain. 3 weeks ago pt got tick off her back. NO CP, SOB or N& V. Pt has appt to see Mayra ReelKate Clark NP today at 1PM; if pt condition changes or worsens prior to appt pt will go to walkin.

## 2016-01-09 ENCOUNTER — Ambulatory Visit (INDEPENDENT_AMBULATORY_CARE_PROVIDER_SITE_OTHER): Payer: No Typology Code available for payment source | Admitting: Internal Medicine

## 2016-01-09 ENCOUNTER — Encounter: Payer: Self-pay | Admitting: Internal Medicine

## 2016-01-09 VITALS — BP 118/88 | HR 60 | Temp 97.8°F | Wt 147.0 lb

## 2016-01-09 DIAGNOSIS — M5414 Radiculopathy, thoracic region: Secondary | ICD-10-CM | POA: Insufficient documentation

## 2016-01-09 MED ORDER — LIDOCAINE HCL 2 % EX GEL: 1.0000 "application " | CUTANEOUS | Status: DC | PRN

## 2016-01-09 NOTE — Progress Notes (Signed)
Pre visit review using our clinic review tool, if applicable. No additional management support is needed unless otherwise documented below in the visit note. 

## 2016-01-09 NOTE — Patient Instructions (Signed)
If the pain is not better in 1-2 weeks, I can set you up with a neurologist for a thorough evaluation.

## 2016-01-09 NOTE — Progress Notes (Signed)
   Subjective:    Patient ID: Kara BolsterJulia D Suits, female    DOB: 01/03/1971, 45 y.o.   MRN: [id number]  HPI "I came down with the shingles" this week Came down with pain and seen here--diagnosed with neuropathic pain and started gabapentin Now has some rash where the pain is Couldn't use the gabapentin so using lidocaine cream  Then noted a spot on left lower eyelid Worried about shingles there Itchy and was rubbing it  Admits to being very anxious about this whole situation  Current Outpatient Prescriptions on File Prior to Visit  Medication Sig Dispense Refill  . cetirizine (ZYRTEC) 10 MG tablet Take 10 mg by mouth daily.    . Cholecalciferol (VITAMIN D PO) Take 1 tablet by mouth daily.    . fluticasone (FLONASE) 50 MCG/ACT nasal spray PLACE 2 SPRAYS INTO BOTH NOSTRILS DAILY. 48 g 3  . Glucosamine HCl (GLUCOSAMINE PO) Take 1 tablet by mouth daily.    Marland Kitchen. ibuprofen (ADVIL,MOTRIN) 200 MG tablet Take 200 mg by mouth every 6 (six) hours as needed.     No current facility-administered medications on file prior to visit.    Allergies  Allergen Reactions  . Sulfonamide Derivatives     REACTION: Rash as a teenager    Past Medical History  Diagnosis Date  . Fracture of coccyx (HCC)     H/O distantly  . GERD (gastroesophageal reflux disease)   . IBS (irritable bowel syndrome)   . Anxiety disorder   . Depression   . Gout   . Allergic rhinitis     Past Surgical History  Procedure Laterality Date  . Breast enhancement surgery    . Tubal ligation  1998    Family History  Problem Relation Age of Onset  . Cancer Father     prostate  . Cancer Maternal Grandmother [phone]    colon  . Diabetes Other     both sides of family    Social History   Social History  . Marital Status: Single    Spouse Name: N/A  . Number of Children: N/A  . Years of Education: N/A   Occupational History  . GafferAccounting manager    Social History Main Topics  . Smoking status: Former Smoker   Types: Cigarettes  . Smokeless tobacco: Never Used     Comment: 2 cigarettes daily  . Alcohol Use: 0.0 oz/week    0 Standard drinks or equivalent per week     Comment: wine 2-3 glasses a night in past, but now 1 glass per night  . Drug Use: No  . Sexual Activity: Not on file   Other Topics Concern  . Not on file   Social History Narrative   Divorced   Daily caffeine- Use 1 coffee/day   Patient gets regular exercise   Review of Systems  Fever 7 days ago--then had the "hot spot"--but this is gone now No cough or breathing problems Some headache Some nausea--but still able to eat     Objective:   Physical Exam  Constitutional: She appears well-developed. No distress.  Eyes:  Tiny white papule on mid lower left lid No distinct lesions  Pulmonary/Chest: Effort normal and breath sounds normal. No respiratory distress. She has no wheezes. She has no rales.  Skin: No rash noted. No erythema.  No shingles rash (or any distinct rash)  Psychiatric:  Mildly anxious          Assessment & Plan:

## 2016-01-09 NOTE — Assessment & Plan Note (Signed)
Clear nerve type pain along left T8-9 No lesions to confirm varicella Reassured nothing seen in eye Didn't tolerate even 1 dose of gabapentin  Advised her to continue the lidocaine cream If no better in 1-2 weeks, can set up with neurology

## 2016-01-23 ENCOUNTER — Encounter: Payer: No Typology Code available for payment source | Admitting: Family Medicine

## 2016-04-30 ENCOUNTER — Encounter: Payer: Self-pay | Admitting: Family Medicine

## 2016-04-30 ENCOUNTER — Ambulatory Visit (INDEPENDENT_AMBULATORY_CARE_PROVIDER_SITE_OTHER): Payer: No Typology Code available for payment source | Admitting: Family Medicine

## 2016-04-30 VITALS — BP 106/62 | HR 79 | Temp 98.5°F | Ht 65.75 in | Wt 145.0 lb

## 2016-04-30 DIAGNOSIS — F410 Panic disorder [episodic paroxysmal anxiety] without agoraphobia: Secondary | ICD-10-CM | POA: Diagnosis not present

## 2016-04-30 DIAGNOSIS — R3989 Other symptoms and signs involving the genitourinary system: Secondary | ICD-10-CM

## 2016-04-30 DIAGNOSIS — F411 Generalized anxiety disorder: Secondary | ICD-10-CM | POA: Insufficient documentation

## 2016-04-30 DIAGNOSIS — N3289 Other specified disorders of bladder: Secondary | ICD-10-CM | POA: Diagnosis not present

## 2016-04-30 DIAGNOSIS — R1031 Right lower quadrant pain: Secondary | ICD-10-CM | POA: Diagnosis not present

## 2016-04-30 DIAGNOSIS — Z0000 Encounter for general adult medical examination without abnormal findings: Secondary | ICD-10-CM

## 2016-04-30 DIAGNOSIS — M797 Fibromyalgia: Secondary | ICD-10-CM

## 2016-04-30 DIAGNOSIS — Z0001 Encounter for general adult medical examination with abnormal findings: Secondary | ICD-10-CM

## 2016-04-30 DIAGNOSIS — F418 Other specified anxiety disorders: Secondary | ICD-10-CM

## 2016-04-30 LAB — POC URINALSYSI DIPSTICK (AUTOMATED)
Bilirubin, UA: NEGATIVE
Glucose, UA: NEGATIVE
Ketones, UA: NEGATIVE
Leukocytes, UA: NEGATIVE
NITRITE UA: NEGATIVE
Protein, UA: NEGATIVE
Spec Grav, UA: 1.025
UROBILINOGEN UA: 0.2
pH, UA: 6

## 2016-04-30 MED ORDER — ALPRAZOLAM 0.5 MG PO TABS: 0.5000 mg | ORAL_TABLET | Freq: Every evening | ORAL | Status: DC | PRN

## 2016-04-30 MED ORDER — FLUOXETINE HCL 10 MG PO TABS: 10.0000 mg | ORAL_TABLET | Freq: Every day | ORAL | Status: DC

## 2016-04-30 NOTE — Patient Instructions (Addendum)
Stop at front desk to set up referral to counseling.  Stop at front desk to set up referral  For US  Start low dose prozac. Use alprazolam as needed.

## 2016-04-30 NOTE — Progress Notes (Signed)
Subjective:    Patient ID: Kara Haas, female    DOB: 08/17/1971, 45 y.o.   MRN: [id number]  HPI  45 year old female presents for annual exam.  She sees GYN for yearly pelvic and breast exam.  BP Readings from Last 3 Encounters:  04/30/16 106/62  01/09/16 118/88  01/05/16 118/82    Labs at [facility]:  CMET nml, glucose 75  A1C 5.1 Lipid panel: total: 185, HDL: 51, LDL115 at goal < 409130 Hg nml TSH nml  GERD: good control on no medication.    Allergies: Stable control on ceterizine.  New onset pain/ pressure in right lower adenexal area x 9 months. Constant ache 1/10 on pain scale. Not worsening. No dysuria, no urgency, no frequency. BM regular. Currently on menses, nml UA today.  Regular She is sexually active, no new partners.  She has history of ovarian cyst in past, not recently.  Diet; healthy Exercise:  4 times a week. Body mass index is 23.58 kg/(m^2).    She is having a lot of panic attacks while on vacation. Occurs at night, feeling of impending doom.  Using alprazolam for panic attack at night. 3 times a week During day anxiety well controlled.  request referral to psychologist. GAD 7: 13  was on prozac and cymbalta in past, tolerated well.  Fibromyalgia, well managed with healthy lifestyle.   No family history of uterine cancer. Social History /Family History/Past Medical History reviewed and updated if needed.     Review of Systems  Constitutional: Negative for fever and fatigue.  HENT: Negative for ear pain.   Eyes: Negative for pain.  Respiratory: Negative for chest tightness and shortness of breath.   Cardiovascular: Negative for chest pain, palpitations and leg swelling.  Gastrointestinal: Negative for abdominal pain.  Genitourinary: Negative for dysuria.       Objective:   Physical Exam  Constitutional: Vital signs are normal. She appears well-developed and well-nourished. She is cooperative.  Non-toxic appearance. She  does not appear ill. No distress.  HENT:  Head: Normocephalic.  Right Ear: Hearing, tympanic membrane, external ear and ear canal normal.  Left Ear: Hearing, tympanic membrane, external ear and ear canal normal.  Nose: Nose normal.  Eyes: Conjunctivae, EOM and lids are normal. Pupils are equal, round, and reactive to light. Lids are everted and swept, no foreign bodies found.  Neck: Trachea normal and normal range of motion. Neck supple. Carotid bruit is not present. No thyroid mass and no thyromegaly present.  Cardiovascular: Normal rate, regular rhythm, S1 normal, S2 normal, normal heart sounds and intact distal pulses.  Exam reveals no gallop.   No murmur heard. Pulmonary/Chest: Effort normal and breath sounds normal. No respiratory distress. She has no wheezes. She has no rhonchi. She has no rales.  Abdominal: Soft. Normal appearance and bowel sounds are normal. She exhibits no distension, no fluid wave, no abdominal bruit and no mass. There is no hepatosplenomegaly. There is tenderness in the right lower quadrant. There is no rebound, no guarding and no CVA tenderness. No hernia.  Lymphadenopathy:    She has no cervical adenopathy.    She has no axillary adenopathy.  Neurological: She is alert. She has normal strength. No cranial nerve deficit or sensory deficit.  Skin: Skin is warm, dry and intact. No rash noted.  Psychiatric: Her speech is normal. Judgment normal. Her mood appears anxious. She is agitated. Cognition and memory are normal. She does not exhibit a depressed mood.  Assessment & Plan:  The patient's preventative maintenance and recommended screening tests for an annual wellness exam were reviewed in full today. Brought up to date unless services declined.  Counselled on the importance of diet, exercise, and its role in overall health and mortality. The patient's FH and SH was reviewed, including their home life, tobacco status, and drug and alcohol status.    Vaccines: Due for tdap  Nonsmoker: Mammo: per GYN, plans to return for pelvic exam next year.  Colon: 2009 HIV: refused  Pelvic: per GYN

## 2016-04-30 NOTE — Assessment & Plan Note (Signed)
Stable control with lifestyle. 

## 2016-04-30 NOTE — Assessment & Plan Note (Signed)
Refer to counseling to learn to manage panic  And return to travel. Start low dose prozac and follow up in  4 weeks.

## 2016-04-30 NOTE — Progress Notes (Signed)
Pre visit review using our clinic review tool, if applicable. No additional management support is needed unless otherwise documented below in the visit note. 

## 2016-04-30 NOTE — Assessment & Plan Note (Signed)
Eval with US. Possibly due to baldder spasm. Not bad enough for medication.

## 2016-05-04 ENCOUNTER — Telehealth: Payer: Self-pay | Admitting: Family Medicine

## 2016-05-04 NOTE — Telephone Encounter (Signed)
[facility] be scheduled with either Dr. Laymond PurserPerrin or Salomon Fickerri Bauert. Pt is aware they will call her to schedule or she can call.

## 2016-05-12 ENCOUNTER — Other Ambulatory Visit: Payer: No Typology Code available for payment source

## 2016-05-14 ENCOUNTER — Ambulatory Visit
Admission: RE | Admit: 2016-05-14 | Discharge: 2016-05-14 | Disposition: A | Discharge status: Home / Self Care | Payer: No Typology Code available for payment source | Source: Ambulatory Visit | Attending: Family Medicine | Admitting: Family Medicine

## 2016-05-14 DIAGNOSIS — R3989 Other symptoms and signs involving the genitourinary system: Secondary | ICD-10-CM

## 2016-05-28 ENCOUNTER — Ambulatory Visit: Payer: No Typology Code available for payment source | Admitting: Family Medicine

## 2016-06-08 ENCOUNTER — Ambulatory Visit (INDEPENDENT_AMBULATORY_CARE_PROVIDER_SITE_OTHER): Payer: No Typology Code available for payment source | Admitting: Family Medicine

## 2016-06-08 ENCOUNTER — Encounter: Payer: Self-pay | Admitting: Family Medicine

## 2016-06-08 DIAGNOSIS — F418 Other specified anxiety disorders: Secondary | ICD-10-CM | POA: Diagnosis not present

## 2016-06-08 DIAGNOSIS — F410 Panic disorder [episodic paroxysmal anxiety] without agoraphobia: Secondary | ICD-10-CM | POA: Diagnosis not present

## 2016-06-08 MED ORDER — FLUOXETINE HCL 10 MG PO TABS: 10.0000 mg | ORAL_TABLET | Freq: Every day | ORAL | 1 refills | Status: DC

## 2016-06-08 NOTE — Assessment & Plan Note (Signed)
Improving with prozac. Still issue that would benefit from counseling, still issues traveling.

## 2016-06-08 NOTE — Assessment & Plan Note (Signed)
Decreasing frequency, using alprazolam less than previously.

## 2016-06-08 NOTE — Patient Instructions (Signed)
Continue  prozac 10 mg daily.  Keep the appointment with Dr. Laymond PurserPerrin.  Let me know if mood not continuing to improve.

## 2016-06-08 NOTE — Progress Notes (Signed)
   Subjective:    Patient ID: Kara Haas, female    DOB: 04/15/1971, 45 y.o.   MRN: [id number]  HPI  45 year old female presents for  follow up  Panic attacks.  She is currently on prozac  10 mg daily. Using alprazolam 3 times a week at night.  At last OV 7/14: She is having a lot of panic attacks while on vacation. Occurs at night, feeling of impending doom.  Using alprazolam for panic attack at night. 3 times a week During day anxiety well controlled.  request referral to psychologist. GAD 7: 13  Today: GAD 7: 13 She reports she has had some improvement in mood in last 2 weeks. She is still having worry and anxiety but able to stop it stressed overall.faster. She is sleeping better and is feeling less.  She is still having panic with travel.   She has appt with dr Laymond PurserPerrin, for counseling.  She is not interested in increasing the medication at this time.   Review of Systems  Constitutional: Negative for fatigue and fever.  HENT: Negative for ear pain.   Eyes: Negative for pain.  Respiratory: Negative for chest tightness and shortness of breath.   Cardiovascular: Negative for chest pain, palpitations and leg swelling.  Gastrointestinal: Negative for abdominal pain.  Genitourinary: Negative for dysuria.       Objective:   Physical Exam  Constitutional: Vital signs are normal. She appears well-developed and well-nourished. She is cooperative.  Non-toxic appearance. She does not appear ill. No distress.  HENT:  Head: Normocephalic.  Right Ear: Hearing, tympanic membrane, external ear and ear canal normal. Tympanic membrane is not erythematous, not retracted and not bulging.  Left Ear: Hearing, tympanic membrane, external ear and ear canal normal. Tympanic membrane is not erythematous, not retracted and not bulging.  Nose: No mucosal edema or rhinorrhea. Right sinus exhibits no maxillary sinus tenderness and no frontal sinus tenderness. Left sinus exhibits no maxillary sinus  tenderness and no frontal sinus tenderness.  Mouth/Throat: Uvula is midline, oropharynx is clear and moist and mucous membranes are normal.  Eyes: Conjunctivae, EOM and lids are normal. Pupils are equal, round, and reactive to light. Lids are everted and swept, no foreign bodies found.  Neck: Trachea normal and normal range of motion. Neck supple. Carotid bruit is not present. No thyroid mass and no thyromegaly present.  Cardiovascular: Normal rate, regular rhythm, S1 normal, S2 normal, normal heart sounds, intact distal pulses and normal pulses.  Exam reveals no gallop and no friction rub.   No murmur heard. Pulmonary/Chest: Effort normal and breath sounds normal. No tachypnea. No respiratory distress. She has no decreased breath sounds. She has no wheezes. She has no rhonchi. She has no rales.  Abdominal: Soft. Normal appearance and bowel sounds are normal. There is no tenderness.  Neurological: She is alert.  Skin: Skin is warm, dry and intact. No rash noted.  Psychiatric: Her speech is normal and behavior is normal. Judgment and thought content normal. Her mood appears not anxious. Cognition and memory are normal. She does not exhibit a depressed mood.          Assessment & Plan:

## 2016-06-08 NOTE — Progress Notes (Signed)
Pre visit review using our clinic review tool, if applicable. No additional management support is needed unless otherwise documented below in the visit note. 

## 2016-07-02 ENCOUNTER — Other Ambulatory Visit: Payer: Self-pay | Admitting: Family Medicine

## 2016-07-02 MED ORDER — ALPRAZOLAM 0.5 MG PO TABS: 0.5000 mg | ORAL_TABLET | Freq: Every evening | ORAL | 0 refills | Status: DC | PRN

## 2016-07-02 NOTE — Telephone Encounter (Signed)
Last office visit 06/08/2016.  Last refilled 04/30/2016 for #30 with no refills.

## 2016-07-02 NOTE — Telephone Encounter (Signed)
Alprazolam called into [facility]. 

## 2016-07-16 ENCOUNTER — Ambulatory Visit (INDEPENDENT_AMBULATORY_CARE_PROVIDER_SITE_OTHER): Payer: No Typology Code available for payment source | Admitting: Psychology

## 2016-07-16 ENCOUNTER — Ambulatory Visit: Payer: No Typology Code available for payment source | Admitting: Psychology

## 2016-07-16 DIAGNOSIS — F410 Panic disorder [episodic paroxysmal anxiety] without agoraphobia: Secondary | ICD-10-CM | POA: Diagnosis not present

## 2016-07-16 DIAGNOSIS — F411 Generalized anxiety disorder: Secondary | ICD-10-CM

## 2016-07-30 ENCOUNTER — Ambulatory Visit (INDEPENDENT_AMBULATORY_CARE_PROVIDER_SITE_OTHER): Payer: No Typology Code available for payment source | Admitting: Psychology

## 2016-07-30 DIAGNOSIS — F411 Generalized anxiety disorder: Secondary | ICD-10-CM | POA: Diagnosis not present

## 2016-07-30 DIAGNOSIS — F410 Panic disorder [episodic paroxysmal anxiety] without agoraphobia: Secondary | ICD-10-CM | POA: Diagnosis not present

## 2016-08-16 ENCOUNTER — Other Ambulatory Visit: Payer: Self-pay | Admitting: Family Medicine

## 2016-08-16 ENCOUNTER — Encounter: Payer: Self-pay | Admitting: Family Medicine

## 2016-08-16 NOTE — Telephone Encounter (Signed)
Last office visit 06/08/2016.  Last refilled 07/02/2016 for #30 with no refills.  Ok to refill?

## 2016-08-17 MED ORDER — FLUOXETINE HCL 10 MG PO TABS: 20.0000 mg | ORAL_TABLET | Freq: Every day | ORAL | 1 refills | Status: DC

## 2016-08-17 MED ORDER — ALPRAZOLAM 0.5 MG PO TABS: 0.5000 mg | ORAL_TABLET | Freq: Every evening | ORAL | 0 refills | Status: DC | PRN

## 2016-08-17 NOTE — Telephone Encounter (Signed)
Alprazolam called into [facility]. 

## 2016-08-27 ENCOUNTER — Ambulatory Visit: Payer: No Typology Code available for payment source | Admitting: Psychology

## 2016-09-13 ENCOUNTER — Other Ambulatory Visit: Payer: Self-pay | Admitting: *Deleted

## 2016-09-13 MED ORDER — FLUOXETINE HCL 20 MG PO TABS: 20.0000 mg | ORAL_TABLET | Freq: Every day | ORAL | 1 refills | Status: DC

## 2016-09-13 NOTE — Telephone Encounter (Signed)
[facility] for Fluoxetine 20 mg tablets.  Sent as requested.  Patent's dose was increased to 20 mg on 08/17/16 by Dr. Ermalene SearingBedsole.

## 2016-10-04 ENCOUNTER — Other Ambulatory Visit: Payer: Self-pay | Admitting: Family Medicine

## 2016-11-11 ENCOUNTER — Other Ambulatory Visit: Payer: Self-pay | Admitting: Family Medicine

## 2016-11-11 MED ORDER — ALPRAZOLAM 0.5 MG PO TABS: 0.5000 mg | ORAL_TABLET | Freq: Every evening | ORAL | 0 refills | Status: DC | PRN

## 2016-11-11 NOTE — Telephone Encounter (Signed)
[facility]raz[facility]am called [id number] [place], [place] - [address] Phone: [phone] 

## 2016-11-11 NOTE — Telephone Encounter (Signed)
Last office visit 06/08/2016.  Last refilled 08/17/2016 for #30 with no refills.  Ok to refill?

## 2016-11-19 IMAGING — MG MM DIAG BREAST W/IMPLANT TOMO BILATERAL
8 of 12 series · 8 of 28 positions shown · non-contrast
Comparison: 09/26/2012

ADDENDUM:
DIGITAL BREAST TOMOSYNTHESIS

Digital breast tomosynthesis images are acquired in two projections.
These images are reviewed in combination with the digital mammogram,
confirming the findings below.
CLINICAL DATA: Delayed 6 month followup for left breast cyst.
Patient had aspiration of a cyst in the 1 o'clock location of the
left breast in September 2012. Aspiration revealed bloody fluid which
was benign on cytology. The patient reports a tender left breast
cyst today.
EXAM:
DIGITAL DIAGNOSTIC  BILATERAL MAMMOGRAM WITH CAD
ULTRASOUND LEFT BREAST

[L MLO (1 of 2)]
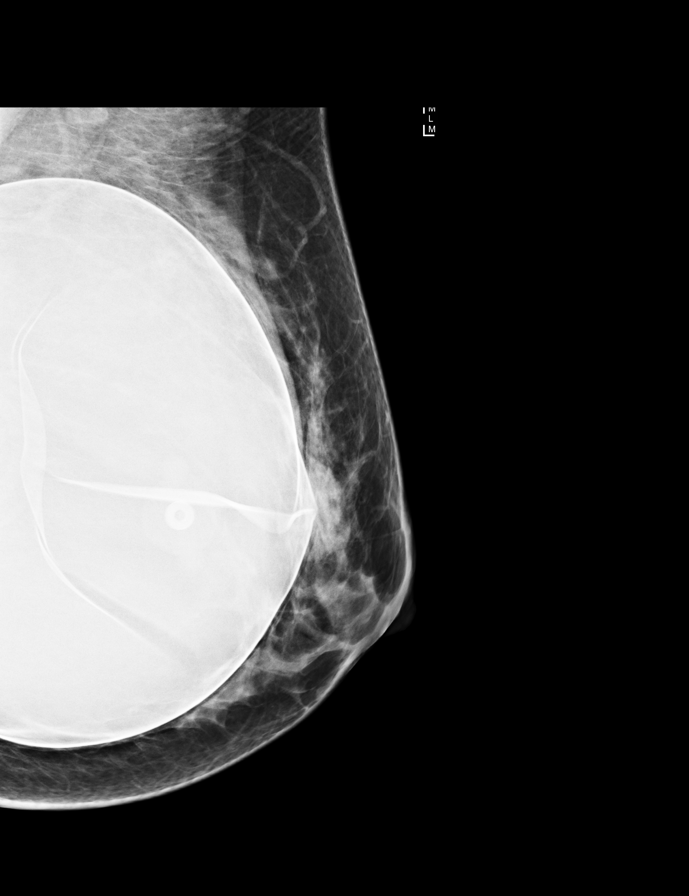

[R MLO (1 of 2)]
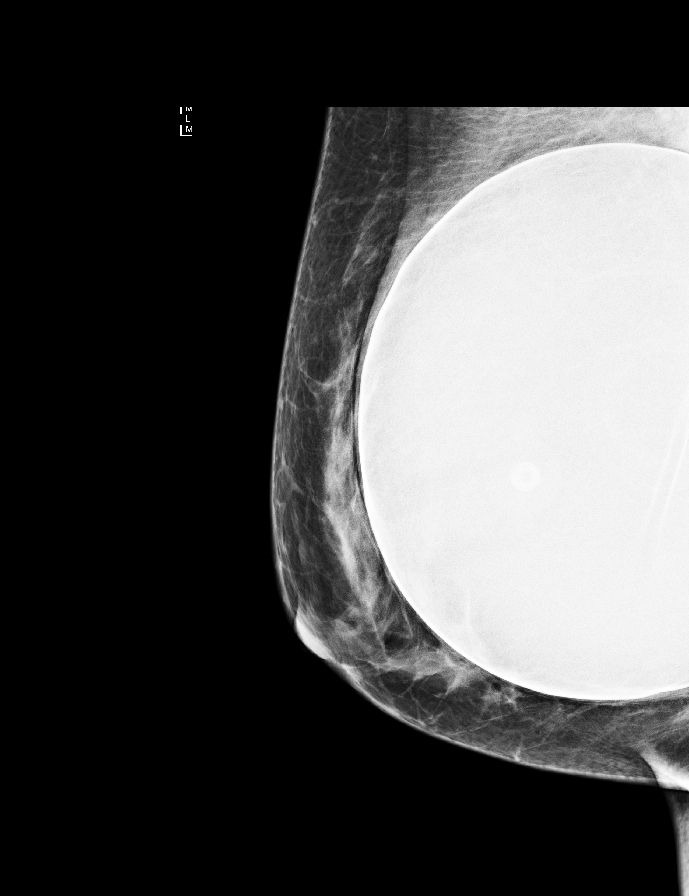

[L CC (1 of 2)]
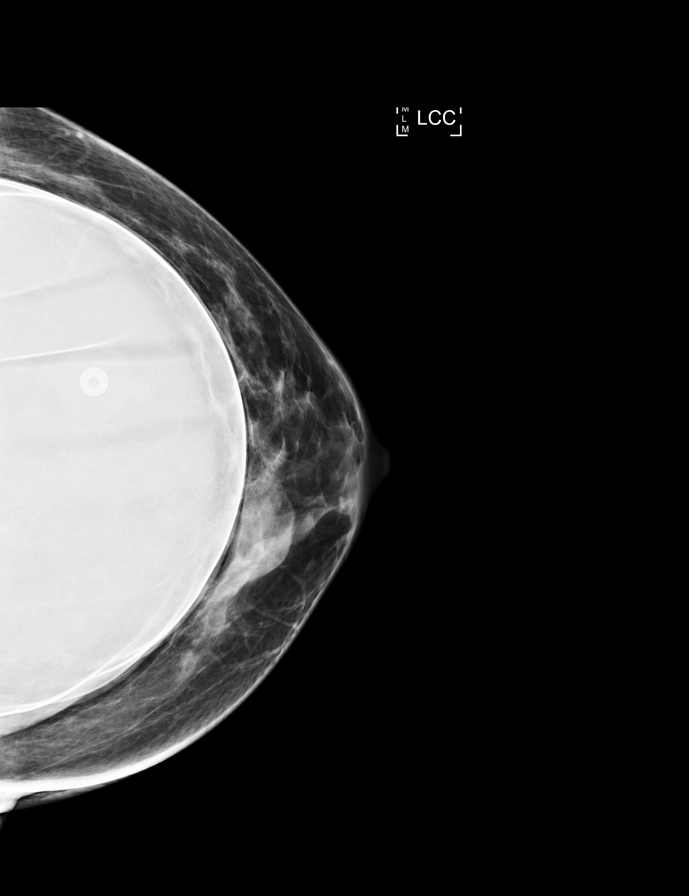

[R CC (1 of 2)]
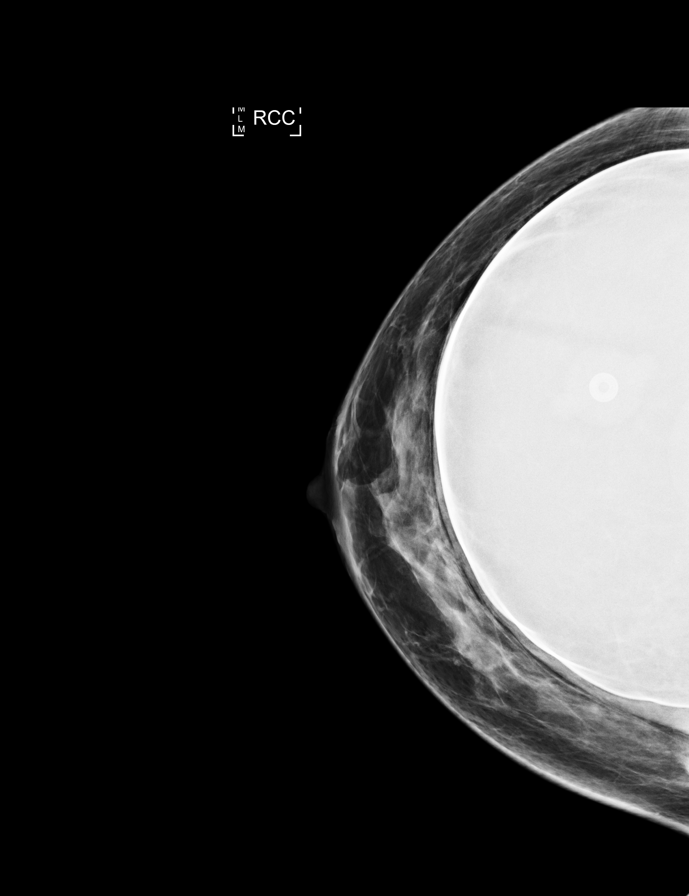

[R MLO (2 of 2)]
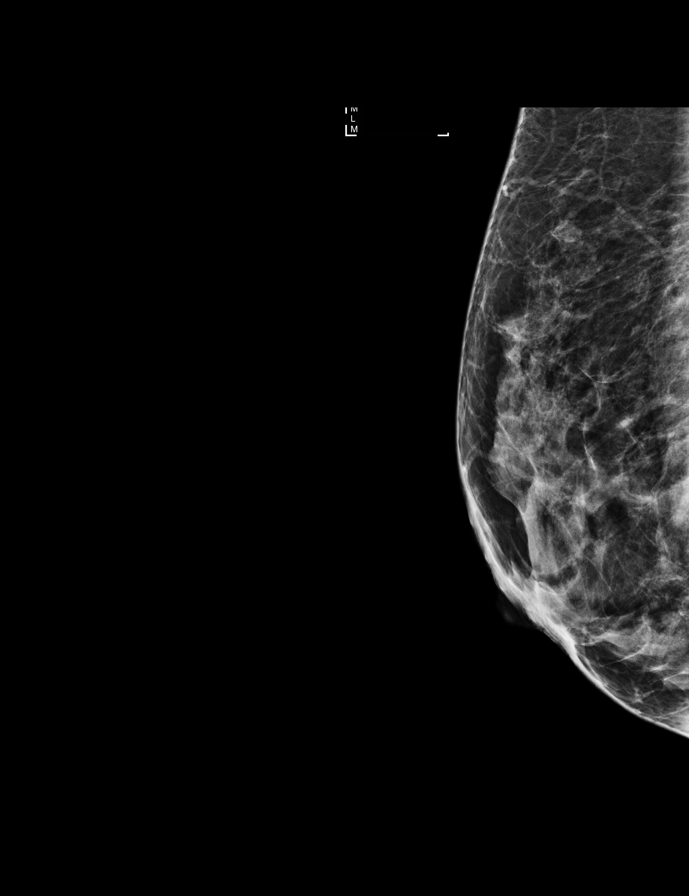

[R CC (2 of 2)]
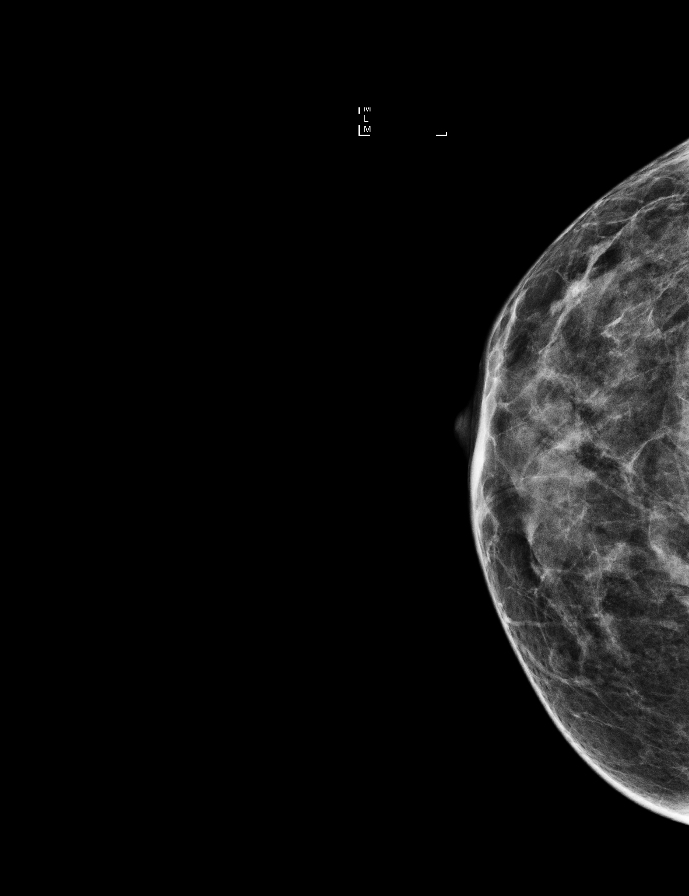

[L MLO (2 of 2)]
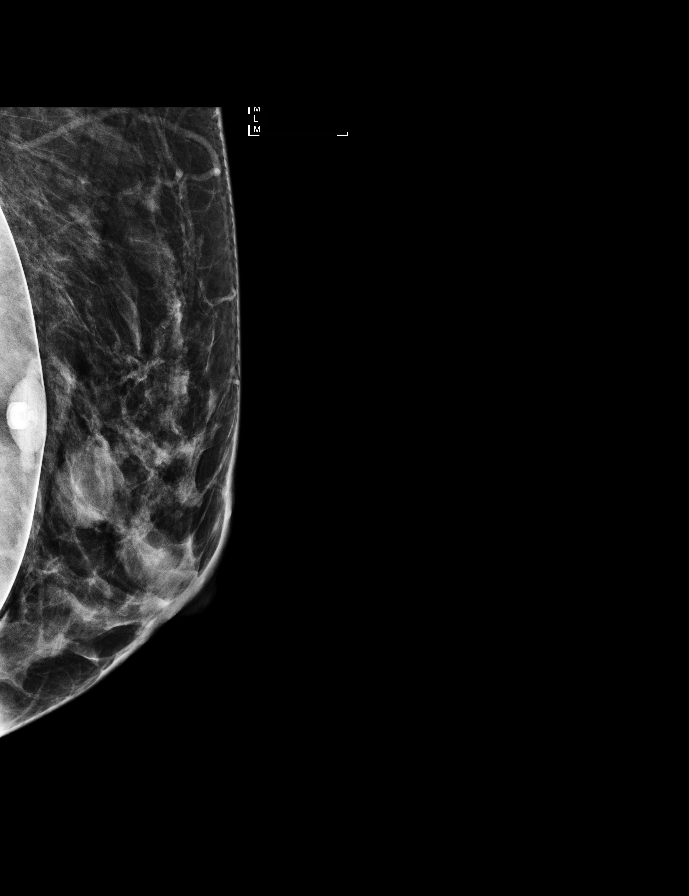

[L CC (2 of 2)]
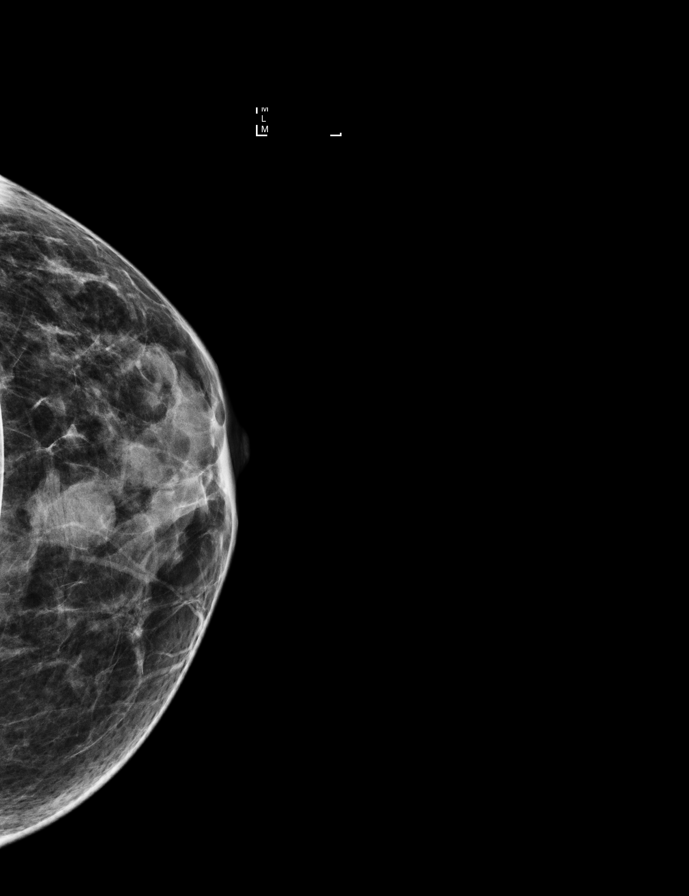

[8 of 28 positions shown; findings below may reference images not displayed]

ACR Breast Density Category c: The breast tissue is heterogeneously
dense, which may obscure small masses.
FINDINGS: The patient has bilateral retropectoral saline implants. There are
retroareolar masses on the left, partially obscured and
circumscribed. Right breast is negative.

Mammographic images were processed with CAD.

On physical exam, I palpated discrete rounded mobile mass in the 11
o'clock location of the left breast. The patient reports this is
tender on exam. I palpate no abnormality in the 1 o'clock location.

Ultrasound is performed, showing a simple cyst in the 11 o'clock
location of the right breast 4 cm from the nipple which measures
x 1.2 x 1.8 cm. A group of cysts in the 1 o'clock location of the
left breast 2 cm from the nipple measuring 0.9 x 0.5 x 1.1 cm. This
correlates well with the location of the previously aspirated cyst
showing benign contents.
IMPRESSION: 1.  No mammographic or ultrasound evidence for malignancy.
2. Left breast cyst in the 11 o'clock location which is tender. The
patient reports this is painful and bothersome and requests
aspiration. This is performed on the same day and dictated
separately.
3. Adjacent cysts in the 1 o'clock location of the left breast
consistent with residual cysts following benign aspiration 2 years
ago. No further workup is felt to be necessary for this lesion.

RECOMMENDATION:
1. Aspiration of cyst in the 11 o'clock location left breast.
2.  Screening mammogram in one year.(Code:KC-8-GRR)

I have discussed the findings and recommendations with the patient.
Results were also provided in writing at the conclusion of the
visit. If applicable, a reminder letter will be sent to the patient
regarding the next appointment.

BI-RADS CATEGORY  2: Benign.

## 2017-01-06 ENCOUNTER — Encounter: Payer: Self-pay | Admitting: Internal Medicine

## 2017-01-06 ENCOUNTER — Ambulatory Visit (INDEPENDENT_AMBULATORY_CARE_PROVIDER_SITE_OTHER): Payer: No Typology Code available for payment source | Admitting: Internal Medicine

## 2017-01-06 VITALS — BP 108/66 | HR 68 | Temp 97.9°F | Wt 149.0 lb

## 2017-01-06 DIAGNOSIS — B9789 Other viral agents as the cause of diseases classified elsewhere: Secondary | ICD-10-CM

## 2017-01-06 DIAGNOSIS — J329 Chronic sinusitis, unspecified: Secondary | ICD-10-CM | POA: Diagnosis not present

## 2017-01-06 MED ORDER — PREDNISONE 10 MG PO TABS: ORAL_TABLET | ORAL | 0 refills | Status: DC

## 2017-01-06 NOTE — Progress Notes (Signed)
HPI  Pt presents to the clinic today with c/o headache, facial pain and pressure, ear fullness, nasal congestion and cough. This started 3-4 days ago. The headache is located in her sinuses. She denies ear pain or decreased hearing but has had some dizziness. She is not blowing anything out of her nose. She denies difficulty swallowing. The cough is nonproductive. She denies fever, chills or body aches. She has tried Zyrtec, Flonase and took Mucinex Max this morning and felt much worse after that. She has a history of seasonal allergies. She has not had sick contacts that she is aware of.   Review of Systems     Past Medical History:  Diagnosis Date  . Allergic rhinitis   . Anxiety disorder   . Depression   . Fracture of coccyx (HCC)    H/O distantly  . GERD (gastroesophageal reflux disease)   . Gout   . IBS (irritable bowel syndrome)     Family History  Problem Relation Age of Onset  . Cancer Father     prostate  . Cancer Maternal Grandmother 29    colon  . Diabetes Other     both sides of family    Social History   Social History  . Marital status: Single    Spouse name: N/A  . Number of children: N/A  . Years of education: N/A   Occupational History  . Accounting Runner, [employer]   Social History Main Topics  . Smoking status: Former Smoker    Types: Cigarettes  . Smokeless tobacco: Never Used     Comment: 2 cigarettes daily  . Alcohol use 0.0 oz/week     Comment: wine 2-3 glasses a night in past, but now 1 glass per night  . Drug use: No  . Sexual activity: Not on file   Other Topics Concern  . Not on file   Social History Narrative   Divorced   Daily caffeine- Use 1 coffee/day   Patient gets regular exercise    Allergies  Allergen Reactions  . Sulfonamide Derivatives     REACTION: Rash as a teenager     Constitutional: Positive headache. Denies fatigue, fever or abrupt weight changes.  HEENT:  Positive eye pain, facial pain, nasal  congestion. Denies eye redness, ear pain, ringing in the ears, wax buildup, runny nose or sore throat. Respiratory: Positive cough. Denies difficulty breathing or shortness of breath.  Cardiovascular: Denies chest pain, chest tightness, palpitations or swelling in the hands or feet.   No other specific complaints in a complete review of systems (except as listed in HPI above).  Objective:   BP 108/66   Pulse 68   Temp 97.9 F (36.6 C) (Oral)   Wt 149 lb (67.6 kg)   SpO2 98%   BMI 24.23 kg/m   General: Appears her stated age, in NAD. HEENT: Head: normal shape and size, maxillary and frontal sinus tenderness noted; Ears: Tm's pink and intact, normal light reflex; Nose: mucosa pink and moist, septum midline; Throat/Mouth: + PND. Teeth present, mucosa erythematous and moist, no exudate noted, no lesions or ulcerations noted.  Neck:  No adenopathy noted.  Cardiovascular: Normal rate and rhythm. S1,S2 noted.  No murmur, rubs or gallops noted.  Pulmonary/Chest: Normal effort and positive vesicular breath sounds. No respiratory distress. No wheezes, rales or ronchi noted.       Assessment & Plan:   Viral Sinusitis:  Can use a Neti Pot which can be purchased from your local  drug store. Continue Flonase and Zyrtec eRx for eRx for Pred Taper x 6 days Return precautions discussed  RTC as needed or if symptoms persist. Nicki ReaperBAITY, Alesi Zachery, NP

## 2017-01-06 NOTE — Patient Instructions (Signed)

## 2017-01-18 ENCOUNTER — Other Ambulatory Visit: Payer: Self-pay | Admitting: Family Medicine

## 2017-01-18 MED ORDER — ALPRAZOLAM 0.5 MG PO TABS
0.5000 mg | ORAL_TABLET | Freq: Every evening | ORAL | 0 refills | Status: DC | PRN
Start: 2017-01-18 — End: 2017-03-18

## 2017-01-18 NOTE — Telephone Encounter (Signed)
[facility]am called [id number] - [place], [place] - [address] Phone: [phone] 

## 2017-01-18 NOTE — Telephone Encounter (Signed)
Last office visit 01/06/17 with R. Baity.  Last refilled 11/11/16 for 11/11/2016 for #30 with no refills.  Ok to refill?

## 2017-03-15 ENCOUNTER — Telehealth: Payer: Self-pay | Admitting: Family Medicine

## 2017-03-15 DIAGNOSIS — Z13220 Encounter for screening for lipoid disorders: Secondary | ICD-10-CM

## 2017-03-15 NOTE — Telephone Encounter (Signed)
-----   Message from Terri J Walsh sent at 03/07/2017  4:05 PM EDT ----- Regarding: Lab orders for Wednesday, 5.30.18 Patient is scheduled for CPX labs, please order future labs, Thanks , Terri  

## 2017-03-16 ENCOUNTER — Other Ambulatory Visit (INDEPENDENT_AMBULATORY_CARE_PROVIDER_SITE_OTHER): Payer: No Typology Code available for payment source

## 2017-03-16 DIAGNOSIS — Z13220 Encounter for screening for lipoid disorders: Secondary | ICD-10-CM

## 2017-03-16 LAB — COMPREHENSIVE METABOLIC PANEL
ALT: 13 U/L (ref 0–35)
AST: 16 U/L (ref 0–37)
Albumin: 4 g/dL (ref 3.5–5.2)
Alkaline Phosphatase: 41 U/L (ref 39–117)
BUN: 13 mg/dL (ref 6–23)
CO2: 28 mEq/L (ref 19–32)
CREATININE: 0.71 mg/dL (ref 0.40–1.20)
Calcium: 8.7 mg/dL (ref 8.4–10.5)
Chloride: 107 mEq/L (ref 96–112)
GFR: 93.36 mL/min (ref >60.00)
Glucose, Bld: 90 mg/dL (ref 70–99)
Potassium: 3.9 mEq/L (ref 3.5–5.1)
SODIUM: 138 meq/L (ref 135–145)
Total Bilirubin: 0.3 mg/dL (ref 0.2–1.2)
Total Protein: 6.2 g/dL (ref 6.0–8.3)

## 2017-03-16 LAB — LIPID PANEL
CHOLESTEROL: 194 mg/dL (ref 0–200)
HDL: 61.7 mg/dL (ref >39.00)
LDL CALC: 117 mg/dL — AB (ref 0–99)
NONHDL: 132.36
Total CHOL/HDL Ratio: 3
Triglycerides: 77 mg/dL (ref 0.0–149.0)
VLDL: 15.4 mg/dL (ref 0.0–40.0)

## 2017-03-16 NOTE — Telephone Encounter (Signed)
-----   Message from Terri J Walsh sent at 03/07/2017  4:05 PM EDT ----- Regarding: Lab orders for Wednesday, 5.30.18 Patient is scheduled for CPX labs, please order future labs, Thanks , Terri  

## 2017-03-18 ENCOUNTER — Other Ambulatory Visit (HOSPITAL_COMMUNITY)
Admission: RE | Admit: 2017-03-18 | Discharge: 2017-03-18 | Disposition: A | Discharge status: Home / Self Care | Payer: No Typology Code available for payment source | Source: Ambulatory Visit | Attending: Family Medicine | Admitting: Family Medicine

## 2017-03-18 ENCOUNTER — Encounter: Payer: Self-pay | Admitting: Family Medicine

## 2017-03-18 ENCOUNTER — Ambulatory Visit (INDEPENDENT_AMBULATORY_CARE_PROVIDER_SITE_OTHER): Payer: No Typology Code available for payment source | Admitting: Family Medicine

## 2017-03-18 VITALS — BP 102/70 | HR 65 | Ht 66.0 in | Wt 149.5 lb

## 2017-03-18 DIAGNOSIS — N951 Menopausal and female climacteric states: Secondary | ICD-10-CM | POA: Diagnosis not present

## 2017-03-18 DIAGNOSIS — Z0000 Encounter for general adult medical examination without abnormal findings: Secondary | ICD-10-CM

## 2017-03-18 DIAGNOSIS — F411 Generalized anxiety disorder: Secondary | ICD-10-CM

## 2017-03-18 DIAGNOSIS — Z0001 Encounter for general adult medical examination with abnormal findings: Secondary | ICD-10-CM

## 2017-03-18 DIAGNOSIS — Z124 Encounter for screening for malignant neoplasm of cervix: Secondary | ICD-10-CM | POA: Diagnosis not present

## 2017-03-18 DIAGNOSIS — J301 Allergic rhinitis due to pollen: Secondary | ICD-10-CM

## 2017-03-18 DIAGNOSIS — M797 Fibromyalgia: Secondary | ICD-10-CM | POA: Diagnosis not present

## 2017-03-18 DIAGNOSIS — M7751 Other enthesopathy of right foot: Secondary | ICD-10-CM | POA: Diagnosis not present

## 2017-03-18 MED ORDER — ALPRAZOLAM 0.5 MG PO TABS: 0.5000 mg | ORAL_TABLET | Freq: Every evening | ORAL | 0 refills | Status: DC | PRN

## 2017-03-18 MED ORDER — FLUOXETINE HCL 20 MG PO TABS: 30.0000 mg | ORAL_TABLET | Freq: Every day | ORAL | 11 refills | Status: DC

## 2017-03-18 NOTE — Assessment & Plan Note (Signed)
Stable control with lifestyle and NSAIDs prn.

## 2017-03-18 NOTE — Assessment & Plan Note (Signed)
Well controlled. Continue current medication.  

## 2017-03-18 NOTE — Progress Notes (Signed)
Subjective:    Patient ID: Kara BolsterJulia D Faerber, female    DOB: 11/08/1970, 46 y.o.   MRN: [id number]  HPI   46 year old female presents for annual exam.   She has had some recent light menses.. Off and on since 5/13.  She has been feeling some hot flashes.   Allergic rhinitis: Stable control on flonase and zyrtec.   GERD: resolved on no med.   GAD: Anxiety is much worse  before and after menses. Worst at night.  She is no longer seeing psychiatrist. She is currently on  fluoxetine 20 mg daily. Using alprazolam every other day or so.  She needs refill.  Fibromyalgia: Controlled with healthy lifestyle.   Right ankle pain, lateral : x 1 week, no known injury, some bruising. Pain mild, when resting or standing on it. Hx of achilles tendonitis  Plantar fasciits 3 years ago, worse boot for 3 months.   Reviewed labs in detail.  Social History /Family History/Past Medical History reviewed in detail and updated in EMR if needed. Blood pressure 102/70, pulse 65, height 5\' 6"  (1.676 m), weight 149 lb 8 oz (67.8 kg), last menstrual period 03/13/2017, SpO2 99 %.     Review of Systems  Constitutional: Negative for fatigue, fever and unexpected weight change.  HENT: Negative for congestion, ear pain, sinus pressure, sneezing, sore throat and trouble swallowing.   Eyes: Negative for pain and itching.  Respiratory: Negative for cough, shortness of breath and wheezing.   Cardiovascular: Negative for chest pain, palpitations and leg swelling.  Gastrointestinal: Negative for abdominal pain, blood in stool, constipation, diarrhea and nausea.  Genitourinary: Negative for difficulty urinating, dysuria, hematuria, menstrual problem and vaginal discharge.  Skin: Negative for rash.  Neurological: Negative for syncope, weakness, light-headedness, numbness and headaches.  Psychiatric/Behavioral: Negative for confusion and dysphoric mood. The patient is not nervous/anxious.        Objective:   Physical Exam  Constitutional: Vital signs are normal. She appears well-developed and well-nourished. She is cooperative.  Non-toxic appearance. She does not appear ill. No distress.  HENT:  Head: Normocephalic.  Right Ear: Hearing, tympanic membrane, external ear and ear canal normal.  Left Ear: Hearing, tympanic membrane, external ear and ear canal normal.  Nose: Nose normal.  Eyes: Conjunctivae, EOM and lids are normal. Pupils are equal, round, and reactive to light. Lids are everted and swept, no foreign bodies found.  Neck: Trachea normal and normal range of motion. Neck supple. Carotid bruit is not present. No thyroid mass and no thyromegaly present.  Cardiovascular: Normal rate, regular rhythm, S1 normal, S2 normal, normal heart sounds and intact distal pulses.  Exam reveals no gallop.   No murmur heard. Pulmonary/Chest: Effort normal and breath sounds normal. No respiratory distress. She has no wheezes. She has no rhonchi. She has no rales.  Abdominal: Soft. Normal appearance and bowel sounds are normal. She exhibits no distension, no fluid wave, no abdominal bruit and no mass. There is no hepatosplenomegaly. There is no tenderness. There is no rebound, no guarding and no CVA tenderness. No hernia.  Genitourinary: Vagina normal and uterus normal. No breast swelling, tenderness, discharge or bleeding. Pelvic exam was performed with patient supine. There is no rash, tenderness or lesion on the right labia. There is no rash, tenderness or lesion on the left labia. Uterus is not enlarged and not tender. Cervix exhibits no motion tenderness, no discharge and no friability. Right adnexum displays no mass, no tenderness and no fullness. Left  adnexum displays no mass, no tenderness and no fullness.  Musculoskeletal:  Right lateral ankle anterior and superior to malleolus ttp, no swelling no bruise, full ROM  No clear ttp in area of typical ankle sprain  Lymphadenopathy:    She has no cervical  adenopathy.    She has no axillary adenopathy.  Neurological: She is alert. She has normal strength. No cranial nerve deficit or sensory deficit.  Skin: Skin is warm, dry and intact. No rash noted.  Psychiatric: Her speech is normal and behavior is normal. Judgment normal. Her mood appears not anxious. Cognition and memory are normal. She does not exhibit a depressed mood.          Assessment & Plan:  The patient's preventative maintenance and recommended screening tests for an annual wellness exam were reviewed in full today. Brought up to date unless services declined.  Counselled on the importance of diet, exercise, and its role in overall health and mortality. The patient's FH and SH was reviewed, including their home life, tobacco status, and drug and alcohol status.   Vaccines: Due for tdap.. refused  Nonsmoker Mammo: 11/2015  nml Colon: 2009, repeat not due until age 17 HIV: refused  Pap/DVE: HX of HPV, last pap 2012.. Due now. Pt currently menstruating.

## 2017-03-18 NOTE — Assessment & Plan Note (Signed)
Ice, elevation, ankle supportive shoes, ibuprofen as needed for pain and inflammation. Can consider a lace up ankle brace for support. Follow up if not improving in 2 weeks.

## 2017-03-18 NOTE — Assessment & Plan Note (Signed)
Treat with SSRI as above.

## 2017-03-18 NOTE — Patient Instructions (Addendum)
Set up mammogram on your own.  Continue working on healthy eating and regular exercise. Trial of increase dose of  Fluoxetine.  Call if mood not improving as expected in 4 weeks.  Limit alprazolam as able. Ice, elevation, ankle supportive shoes, ibuprofen as needed for pain and inflammation. Can consider a lace up ankle brace for support. Follow up if not improving in 2 weeks.

## 2017-03-18 NOTE — Assessment & Plan Note (Signed)
Inadeqaute control... Trial of higher dose fluoxetine. Use alprazolam prn.

## 2017-03-18 NOTE — Addendum Note (Signed)
Addended by: Dixie DialsVALENCIA, Gizell Danser on: 03/18/2017 02:38 PM   Modules accepted: Orders

## 2017-03-21 ENCOUNTER — Other Ambulatory Visit: Payer: Self-pay | Admitting: Family Medicine

## 2017-03-22 LAB — CYTOLOGY - PAP
Diagnosis: NEGATIVE
HPV: NOT DETECTED

## 2017-06-09 ENCOUNTER — Other Ambulatory Visit: Payer: Self-pay | Admitting: Family Medicine

## 2017-06-09 MED ORDER — ALPRAZOLAM 0.5 MG PO TABS: 0.5000 mg | ORAL_TABLET | Freq: Every evening | ORAL | 0 refills | Status: DC | PRN

## 2017-06-09 NOTE — Telephone Encounter (Signed)
Last office visit 03/18/2017.  Last refilled 03/18/2017 for #30 with no refills.  Ok to refill?

## 2017-06-09 NOTE — Telephone Encounter (Signed)
[facility]am [id number] [place], [place] - [address] Phone: [phone] 

## 2017-08-05 ENCOUNTER — Other Ambulatory Visit: Payer: Self-pay | Admitting: Family Medicine

## 2017-08-05 NOTE — Telephone Encounter (Signed)
Last filled 06-09-17 #30 Last OV 03-18-17 No Future OV

## 2017-08-08 MED ORDER — ALPRAZOLAM 0.5 MG PO TABS: 0.5000 mg | ORAL_TABLET | Freq: Every evening | ORAL | 0 refills | Status: DC | PRN

## 2017-08-08 NOTE — Telephone Encounter (Signed)
[facility]raz[facility]am called [id number] - [place], [place] - [address] Phone: [phone] 

## 2017-08-17 ENCOUNTER — Other Ambulatory Visit: Payer: Self-pay | Admitting: Family Medicine

## 2017-08-17 DIAGNOSIS — Z1231 Encounter for screening mammogram for malignant neoplasm of breast: Secondary | ICD-10-CM

## 2017-09-12 ENCOUNTER — Ambulatory Visit
Admission: RE | Admit: 2017-09-12 | Discharge: 2017-09-12 | Disposition: A | Discharge status: Home / Self Care | Payer: No Typology Code available for payment source | Source: Ambulatory Visit | Attending: Family Medicine | Admitting: Family Medicine

## 2017-09-12 DIAGNOSIS — Z1231 Encounter for screening mammogram for malignant neoplasm of breast: Secondary | ICD-10-CM

## 2017-10-03 ENCOUNTER — Other Ambulatory Visit: Payer: Self-pay | Admitting: Family Medicine

## 2017-10-03 NOTE — Telephone Encounter (Signed)
Electronic refill Last refill 08/08/17 #30 Last office 03/18/17

## 2017-10-04 MED ORDER — ALPRAZOLAM 0.5 MG PO TABS: 0.5000 mg | ORAL_TABLET | Freq: Every evening | ORAL | 0 refills | Status: DC | PRN

## 2017-10-05 NOTE — Telephone Encounter (Signed)
[facility]praz[facility]am called into [id number] - [place], [place] - [address] 

## 2017-11-24 ENCOUNTER — Other Ambulatory Visit: Payer: Self-pay | Admitting: Family Medicine

## 2017-11-24 MED ORDER — ALPRAZOLAM 0.5 MG PO TABS: 0.5000 mg | ORAL_TABLET | Freq: Every evening | ORAL | 0 refills | Status: DC | PRN

## 2017-11-24 NOTE — Telephone Encounter (Signed)
Last office visit 03/18/2017.  Last refilled 10/04/2017 for #30 with no refills.  Ok to refill?

## 2017-11-30 ENCOUNTER — Other Ambulatory Visit: Payer: Self-pay | Admitting: Family Medicine

## 2018-01-04 ENCOUNTER — Other Ambulatory Visit: Payer: Self-pay | Admitting: Family Medicine

## 2018-01-04 NOTE — Telephone Encounter (Signed)
Last office visit 03/18/2017.  Last refilled 11/24/2017 for #30 with no refills.  Ok to refill?

## 2018-01-05 MED ORDER — ALPRAZOLAM 0.5 MG PO TABS: 0.5000 mg | ORAL_TABLET | Freq: Every evening | ORAL | 0 refills | Status: DC | PRN

## 2018-01-05 NOTE — Telephone Encounter (Signed)
[facility]am called [id number] - [place], [place] - [address] 

## 2018-03-15 ENCOUNTER — Telehealth: Payer: Self-pay | Admitting: Family Medicine

## 2018-03-15 NOTE — Telephone Encounter (Signed)
Last office visit 03/18/2017.  Last refilled 01/05/2018 for #30 with no refills.   Ok to refill?

## 2018-03-16 MED ORDER — ALPRAZOLAM 0.5 MG PO TABS: 0.5000 mg | ORAL_TABLET | Freq: Every evening | ORAL | 0 refills | Status: DC | PRN

## 2018-03-16 NOTE — Telephone Encounter (Signed)
Robin, Please schedule CPE with fasting labs or at least anxiety follow up with Dr. Ermalene Searing.

## 2018-03-16 NOTE — Telephone Encounter (Signed)
Needs to have appt for CPX with labs prior  Or at least anxiety follow upwithing next 30 days. I will refill #30 to last until then

## 2018-03-16 NOTE — Telephone Encounter (Signed)
Lab 6/7 cpx 6/13 Pt aware

## 2018-03-23 ENCOUNTER — Telehealth: Payer: Self-pay | Admitting: Family Medicine

## 2018-03-23 DIAGNOSIS — Z13220 Encounter for screening for lipoid disorders: Secondary | ICD-10-CM

## 2018-03-23 NOTE — Telephone Encounter (Signed)
-----   Message from Alvina Chouerri J Walsh sent at 03/16/2018  3:12 PM EDT ----- Regarding: Lab orders for Friday, 6.7.19 Patient is scheduled for CPX labs, please order future labs, Thanks , Camelia Engerri

## 2018-03-24 ENCOUNTER — Other Ambulatory Visit (INDEPENDENT_AMBULATORY_CARE_PROVIDER_SITE_OTHER): Payer: No Typology Code available for payment source

## 2018-03-24 ENCOUNTER — Other Ambulatory Visit: Payer: Self-pay | Admitting: Family Medicine

## 2018-03-24 DIAGNOSIS — Z113 Encounter for screening for infections with a predominantly sexual mode of transmission: Secondary | ICD-10-CM | POA: Diagnosis not present

## 2018-03-24 DIAGNOSIS — Z13220 Encounter for screening for lipoid disorders: Secondary | ICD-10-CM

## 2018-03-24 DIAGNOSIS — Z0000 Encounter for general adult medical examination without abnormal findings: Secondary | ICD-10-CM

## 2018-03-24 LAB — COMPREHENSIVE METABOLIC PANEL
ALBUMIN: 4.4 g/dL (ref 3.5–5.2)
ALT: 12 U/L (ref 0–35)
AST: 14 U/L (ref 0–37)
Alkaline Phosphatase: 43 U/L (ref 39–117)
BUN: 9 mg/dL (ref 6–23)
CO2: 26 mEq/L (ref 19–32)
CREATININE: 0.76 mg/dL (ref 0.40–1.20)
Calcium: 9.7 mg/dL (ref 8.4–10.5)
Chloride: 103 mEq/L (ref 96–112)
GFR: 85.94 mL/min (ref >60.00)
GLUCOSE: 93 mg/dL (ref 70–99)
Potassium: 3.8 mEq/L (ref 3.5–5.1)
SODIUM: 137 meq/L (ref 135–145)
Total Bilirubin: 0.5 mg/dL (ref 0.2–1.2)
Total Protein: 7.1 g/dL (ref 6.0–8.3)

## 2018-03-24 LAB — LIPID PANEL
CHOL/HDL RATIO: 3
Cholesterol: 201 mg/dL — ABNORMAL HIGH (ref 0–200)
HDL: 63 mg/dL (ref >39.00)
LDL Cholesterol: 121 mg/dL — ABNORMAL HIGH (ref 0–99)
NONHDL: 137.7
Triglycerides: 83 mg/dL (ref 0.0–149.0)
VLDL: 16.6 mg/dL (ref 0.0–40.0)

## 2018-03-24 NOTE — Telephone Encounter (Signed)
Ins will cover Fluoxetine 10mg  caps 3qd to be equivalent to the 20mg  tabs 1.5qd that was directed to reach 30mg  total/thx dmf

## 2018-03-27 LAB — RPR: RPR Ser Ql: NONREACTIVE

## 2018-03-27 LAB — C. TRACHOMATIS/N. GONORRHOEAE RNA
C. TRACHOMATIS RNA, TMA: NOT DETECTED
N. gonorrhoeae RNA, TMA: NOT DETECTED

## 2018-03-27 LAB — HIV ANTIBODY (ROUTINE TESTING W REFLEX): HIV 1&2 Ab, 4th Generation: NONREACTIVE

## 2018-03-29 ENCOUNTER — Encounter: Payer: Self-pay | Admitting: *Deleted

## 2018-03-30 ENCOUNTER — Ambulatory Visit (INDEPENDENT_AMBULATORY_CARE_PROVIDER_SITE_OTHER): Payer: No Typology Code available for payment source | Admitting: Family Medicine

## 2018-03-30 ENCOUNTER — Other Ambulatory Visit: Payer: Self-pay

## 2018-03-30 ENCOUNTER — Encounter: Payer: Self-pay | Admitting: Family Medicine

## 2018-03-30 VITALS — BP 104/78 | HR 69 | Temp 98.8°F | Ht 65.75 in | Wt 146.5 lb

## 2018-03-30 DIAGNOSIS — Z0000 Encounter for general adult medical examination without abnormal findings: Secondary | ICD-10-CM | POA: Diagnosis not present

## 2018-03-30 DIAGNOSIS — F411 Generalized anxiety disorder: Secondary | ICD-10-CM | POA: Diagnosis not present

## 2018-03-30 DIAGNOSIS — M797 Fibromyalgia: Secondary | ICD-10-CM

## 2018-03-30 MED ORDER — ALPRAZOLAM 0.5 MG PO TABS: 0.5000 mg | ORAL_TABLET | Freq: Every evening | ORAL | 0 refills | Status: DC | PRN

## 2018-03-30 NOTE — Assessment & Plan Note (Signed)
Tolerable control with lifestyle change. Does flare with increase in stress.

## 2018-03-30 NOTE — Assessment & Plan Note (Signed)
Worsened control in last 6 month given separation from longtime boyfriend.  Increase use of alprazolam to BID as needed.. But can try higher dose of prozac during menses given significant hormonal component. She is not interested in increasing prozac every day.

## 2018-03-30 NOTE — Progress Notes (Signed)
Subjective:    Patient ID: Kara Haas, female    DOB: 02-06-1971, 47 y.o.   MRN: [id number]  HPI The patient is here for annual wellness exam and preventative care.     She had recent exposure to unprotected sex.. STD testing negative.  No vaginal lesion, no discharge.  Allergic rhinitis: Stable control on flonase and zyrtec.   GERD: resolved on no med.   GAD: Anxiety is much worse she is out of a longterm relationship. Worst at night and  Prior to and on menses.  Using alprazolam at bedtime and occ during menses at night.  She is no longer seeing psychiatrist. She is currently on  fluoxetine 20 mg daily.   Fibromyalgia: Controlled with healthy lifestyle.  Exercise: 3-4 times a week yoga and pilates  Diet: low meat intake, low fat.. Does eat  4 eggs a day  Social History /Family History/Past Medical History reviewed in detail and updated in EMR if needed.  Blood pressure 104/78, pulse 69, temperature 98.8 F (37.1 C), temperature source Oral, height 5' 5.75" (1.67 m), weight 146 lb 8 oz (66.5 kg), last menstrual period 03/05/2018.  Review of Systems  Constitutional: Negative for fatigue and fever.  HENT: Negative for congestion.   Eyes: Negative for pain.  Respiratory: Negative for cough and shortness of breath.   Cardiovascular: Negative for chest pain, palpitations and leg swelling.  Gastrointestinal: Negative for abdominal pain.  Genitourinary: Negative for dysuria and vaginal bleeding.  Musculoskeletal: Negative for back pain.  Neurological: Negative for syncope, light-headedness and headaches.  Psychiatric/Behavioral: Negative for dysphoric mood.       Objective:   Physical Exam  Constitutional: Vital signs are normal. She appears well-developed and well-nourished. She is cooperative.  Non-toxic appearance. She does not appear ill. No distress.  HENT:  Head: Normocephalic.  Right Ear: Hearing, tympanic membrane, external ear and ear canal normal.  Left  Ear: Hearing, tympanic membrane, external ear and ear canal normal.  Nose: Nose normal.  Eyes: Pupils are equal, round, and reactive to light. Conjunctivae, EOM and lids are normal. Lids are everted and swept, no foreign bodies found.  Neck: Trachea normal and normal range of motion. Neck supple. Carotid bruit is not present. No thyroid mass and no thyromegaly present.  Cardiovascular: Normal rate, regular rhythm, S1 normal, S2 normal, normal heart sounds and intact distal pulses. Exam reveals no gallop.  No murmur heard. Pulmonary/Chest: Effort normal and breath sounds normal. No respiratory distress. She has no wheezes. She has no rhonchi. She has no rales.  Abdominal: Soft. Normal appearance and bowel sounds are normal. She exhibits no distension, no fluid wave, no abdominal bruit and no mass. There is no hepatosplenomegaly. There is no tenderness. There is no rebound, no guarding and no CVA tenderness. No hernia.  Lymphadenopathy:    She has no cervical adenopathy.    She has no axillary adenopathy.  Neurological: She is alert. She has normal strength. No cranial nerve deficit or sensory deficit.  Skin: Skin is warm, dry and intact. No rash noted.  Psychiatric: Her speech is normal and behavior is normal. Judgment normal. Her mood appears not anxious. Cognition and memory are normal. She does not exhibit a depressed mood.          Assessment & Plan:  The patient's preventative maintenance and recommended screening tests for an annual wellness exam were reviewed in full today. Brought up to date unless services declined.  Counselled on the importance of diet,  exercise, and its role in overall health and mortality. The patient's FH and SH was reviewed, including their home life, tobacco status, and drug and alcohol status.   Vaccines: Due for tdap.. refused Nonsmoker Mammo: 08/2017 nml Colon: 2009, repeat not due until age 47 HIV: refused Pap/DVE: HX of HPV, last pap 2018 neg and  no HPV. Repeat in  5 years

## 2018-03-30 NOTE — Patient Instructions (Addendum)
Can try taking 40 mg at bedtime of prozac during menses given worsening mood in conjunction with hormone change.  Try to reduce alprazolam dose.  Work on healthy eating and regular exercise as able.  Decrease animal fats in diet, decrease eggs.  Call to set up mammogram on your own

## 2018-06-12 ENCOUNTER — Other Ambulatory Visit: Payer: Self-pay

## 2018-06-12 MED ORDER — ALPRAZOLAM 0.5 MG PO TABS: 0.5000 mg | ORAL_TABLET | Freq: Every evening | ORAL | 0 refills | Status: DC | PRN

## 2018-06-12 NOTE — Telephone Encounter (Signed)
Last office visit 03/30/2018 for CPE.  No future appointments.  Last refilled 03/30/2018 for #60 with no refills.  Ok to refill?

## 2018-08-23 ENCOUNTER — Other Ambulatory Visit: Payer: Self-pay | Admitting: *Deleted

## 2018-08-23 MED ORDER — FLUTICASONE PROPIONATE 50 MCG/ACT NA SUSP: 2.0000 | Freq: Every day | NASAL | 3 refills | Status: DC

## 2018-09-08 ENCOUNTER — Other Ambulatory Visit: Payer: Self-pay

## 2018-09-08 MED ORDER — ALPRAZOLAM 0.5 MG PO TABS: 0.5000 mg | ORAL_TABLET | Freq: Every evening | ORAL | 0 refills | Status: DC | PRN

## 2018-09-08 NOTE — Telephone Encounter (Signed)
Last office visit 03/30/2018.  Last refilled 06/12/2018 for #60 with no refills. No future appointments.

## 2018-11-22 ENCOUNTER — Other Ambulatory Visit: Payer: Self-pay

## 2018-11-22 MED ORDER — ALPRAZOLAM 0.5 MG PO TABS: 0.5000 mg | ORAL_TABLET | Freq: Every evening | ORAL | 0 refills | Status: DC | PRN

## 2018-11-22 MED ORDER — SCOPOLAMINE 1 MG/3DAYS TD PT72: 1.0000 | MEDICATED_PATCH | TRANSDERMAL | 0 refills | Status: DC

## 2018-11-22 NOTE — Telephone Encounter (Signed)
Last office visit 03/30/2018 for CPE.  Last refilled 09/08/2018 for #60 with no refills.  No future appointments.

## 2019-01-31 ENCOUNTER — Telehealth: Payer: Self-pay

## 2019-01-31 NOTE — Telephone Encounter (Signed)
Last office visit 03/30/2018.  Last refilled 11/22/2018 for #60 with no refills.  No future appointments.

## 2019-02-01 MED ORDER — ALPRAZOLAM 0.5 MG PO TABS: 0.5000 mg | ORAL_TABLET | Freq: Every evening | ORAL | 0 refills | Status: DC | PRN

## 2019-02-01 NOTE — Telephone Encounter (Signed)
Appointment 4/17 Pt aware

## 2019-02-01 NOTE — Telephone Encounter (Signed)
Given controlled substance... I will refill med but pt needs 6 month virtual visit ASAP

## 2019-02-02 ENCOUNTER — Ambulatory Visit (INDEPENDENT_AMBULATORY_CARE_PROVIDER_SITE_OTHER): Payer: No Typology Code available for payment source | Admitting: Family Medicine

## 2019-02-02 ENCOUNTER — Encounter: Payer: Self-pay | Admitting: Family Medicine

## 2019-02-02 DIAGNOSIS — A630 Anogenital (venereal) warts: Secondary | ICD-10-CM | POA: Diagnosis not present

## 2019-02-02 DIAGNOSIS — F330 Major depressive disorder, recurrent, mild: Secondary | ICD-10-CM

## 2019-02-02 DIAGNOSIS — F411 Generalized anxiety disorder: Secondary | ICD-10-CM | POA: Diagnosis not present

## 2019-02-02 DIAGNOSIS — F410 Panic disorder [episodic paroxysmal anxiety] without agoraphobia: Secondary | ICD-10-CM

## 2019-02-02 DIAGNOSIS — F339 Major depressive disorder, recurrent, unspecified: Secondary | ICD-10-CM | POA: Insufficient documentation

## 2019-02-02 MED ORDER — FLUTICASONE PROPIONATE 50 MCG/ACT NA SUSP: 2.0000 | Freq: Every day | NASAL | 3 refills | Status: DC

## 2019-02-02 MED ORDER — FLUOXETINE HCL 10 MG PO CAPS: 30.0000 mg | ORAL_CAPSULE | Freq: Every day | ORAL | 5 refills | Status: DC

## 2019-02-02 NOTE — Progress Notes (Signed)
VIRTUAL VISIT Due to national recommendations of social distancing due to COVID 19, a virtual visit is felt to be most appropriate for this patient at this time.   I connected with the patient on 02/02/19 at  9:00 AM EDT by virtual telehealth platform and verified that I am speaking with the correct person using two identifiers.   I discussed the limitations, risks, security and privacy concerns of performing an evaluation and management service by  virtual telehealth platform and the availability of in person appointments. I also discussed with the patient that there may be a patient responsible charge related to this service. The patient expressed understanding and agreed to proceed.  Patient location: Home Provider [facility] Participants: Kara NoraAmy Alsie Haas and Kara BolsterJulia D Haas   Chief Complaint  Patient presents with  . Anxiety  . Depression    History of Present Illness: 48 year old female presents for  GAD/MDD follow up.   She had been off of SSRI in last 6 months. 3 weeks ago started back on prozac 1(0 mg daily) given in crease in GAD and panic attacks. Her mood is often worse with her menses.  Trouble with sleeping. Helps to take SSRi at bedtime.  her mood has started to improve but not adequyate.    She under a lot of stress at work, on her own at home.  She has been feeling more numb latelty No SE to prozac.   She is using alprazolam nightly in last 2 weeks.   [facility]/02[facility]2019 03/30/2018  Decreased Interest 3 0  Down, Depressed, Hopeless 1 0  PHQ - 2 Score 4 0  Altered sleeping 2 -  Tired, decreased energy 3 -  Change in appetite 2 -  Feeling bad or failure about yourself  1 -  Trouble concentrating 3 -  Moving slowly or fidgety/restless 0 -  Suicidal thoughts 0 -  PHQ-9 Score 15 -  Difficult doing work/chores Somewhat difficult -   GAD 7 : Generalized Anxiety Score 06/08/2016 04/30/2016  Nervous, Anxious, on Edge 1 1   Control/stop worrying 1 1  Worry too much - different things 1 1  Trouble relaxing 3 3  Restless 3 3  Easily annoyed or irritable 2 3  Afraid - awful might happen 2 1  Total GAD 7 Score 13 13  Anxiety Difficulty Very difficult Very difficult   She also wishes to discuss 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  Has noted new onset genital warts vaginally. Size of pinky tip. One area of small cluster. No bleeding, no iching. She is upset with this. Adding to stress.  COVID 19 screen No recent travel or known exposure to COVID19 The patient denies respiratory symptoms of COVID 19 at this time.  The importance of social distancing was discussed today.   Review of Systems  Constitutional: Negative for chills and fever.  HENT: Negative for congestion and ear pain.   Eyes: Negative for pain and redness.  Respiratory: Negative for cough and shortness of breath.   Cardiovascular: Negative for chest pain, palpitations and leg swelling.  Gastrointestinal: Negative for abdominal pain, blood in stool, constipation, diarrhea, nausea and vomiting.  Genitourinary: Negative for dysuria.  Musculoskeletal: Negative for falls and myalgias.  Skin: Negative for rash.  Neurological: Negative for dizziness.  Psychiatric/Behavioral: Negative for depression. The patient is not nervous/anxious.       Past Medical History:  Diagnosis Date  . Allergic rhinitis   . Anxiety disorder   .  Depression   . Fracture of coccyx (HCC)    H/O distantly  . GERD (gastroesophageal reflux disease)   . Gout   . IBS (irritable bowel syndrome)     reports that she has quit smoking. Her smoking use included cigarettes. She has never used smokeless tobacco. She reports current alcohol use. She reports that she does not use drugs.   Current Outpatient Medications:  .  ALPRAZolam (XANAX) 0.5 MG tablet, Take 1 tablet (0.5 mg total) by mouth at bedtime as needed for anxiety (MAy take additional daytime dose as neede).,  Disp: 60 tablet, Rfl: 0 .  cetirizine (ZYRTEC) 10 MG tablet, Take 10 mg by mouth daily., Disp: , Rfl:  .  FLUoxetine (PROZAC) 10 MG capsule, TAKE 3 CAPSULES BY MOUTH EVERY DAY, Disp: 90 capsule, Rfl: 7 .  fluticasone (FLONASE) 50 MCG/ACT nasal spray, Place 2 sprays into both nostrils daily., Disp: 48 g, Rfl: 3 .  ibuprofen (ADVIL,MOTRIN) 200 MG tablet, Take 200 mg by mouth every 6 (six) hours as needed., Disp: , Rfl:    Observations/Objective: There were no vitals taken for this visit.  Physical Exam  Physical Exam Constitutional:      General: She is not in acute distress. Pulmonary:     Effort: Pulmonary effort is normal. No respiratory distress.  Neurological:     Mental Status: She is alert and oriented to person, place, and time.  Psychiatric:        Mood and Affect: Mood normal.        Behavior: Behavior normal.   Assessment and Plan   MDD (major depressive disorder), recurrent episode (HCC)  Inadeqaute control.. continue 20 mg dauily prozac but if not continuing to improve mood.. increase to 30 mg daily.   GAD (generalized anxiety disorder) Worsened control.. use alprazolam as needed daily but if SSRI improving mood, discussed decreased use of benzodiazepine.  Genital warts Description consistent with genital warts but no exam available to verify. Will refer to GYn for non-urgent treat with likely cryotherapy vs topical.   I discussed the assessment and treatment plan with the patient. The patient was provided an opportunity to ask questions and all were answered. The patient agreed with the plan and demonstrated an understanding of the instructions.   The patient was advised to call back or seek an in-person evaluation if the symptoms worsen or if the condition fails to improve as anticipated.     Kara Nora, MD

## 2019-02-02 NOTE — Assessment & Plan Note (Signed)
Worsened control.. use alprazolam as needed daily but if SSRI improving mood, discussed decreased use of benzodiazepine.

## 2019-02-02 NOTE — Assessment & Plan Note (Signed)
Inadeqaute control.. continue 20 mg dauily prozac but if not continuing to improve mood.. increase to 30 mg daily.

## 2019-02-02 NOTE — Assessment & Plan Note (Signed)
Description consistent with genital warts but no exam available to verify. Will refer to GYn for non-urgent treat with likely cryotherapy vs topical.

## 2019-02-02 NOTE — Patient Instructions (Addendum)
Continue at 20 mg daily of prozac but if mood not improving 2 weeks then increase to 30 mg daily.  As mood improving decrease use of alprazolam.  Call if interested in counseling.  We will  Call you with the referral to Gynecology.

## 2019-03-27 ENCOUNTER — Other Ambulatory Visit: Payer: Self-pay | Admitting: Family Medicine

## 2019-03-28 NOTE — Telephone Encounter (Signed)
Last office visit 02/02/2019 for GAD.  Last refilled 02/01/2019 for #60 with no refills.  No future appointments.

## 2019-06-12 ENCOUNTER — Other Ambulatory Visit: Payer: Self-pay | Admitting: Family Medicine

## 2019-06-12 DIAGNOSIS — Z1231 Encounter for screening mammogram for malignant neoplasm of breast: Secondary | ICD-10-CM

## 2019-06-21 ENCOUNTER — Ambulatory Visit
Admission: RE | Admit: 2019-06-21 | Discharge: 2019-06-21 | Disposition: A | Discharge status: Home / Self Care | Payer: No Typology Code available for payment source | Source: Ambulatory Visit | Attending: Family Medicine | Admitting: Family Medicine

## 2019-06-21 ENCOUNTER — Other Ambulatory Visit: Payer: Self-pay

## 2019-06-21 DIAGNOSIS — Z1231 Encounter for screening mammogram for malignant neoplasm of breast: Secondary | ICD-10-CM

## 2019-07-11 ENCOUNTER — Telehealth: Payer: Self-pay | Admitting: Family Medicine

## 2019-07-11 DIAGNOSIS — Z13220 Encounter for screening for lipoid disorders: Secondary | ICD-10-CM

## 2019-07-11 NOTE — Telephone Encounter (Signed)
-----   Message from Cloyd Stagers, RT sent at 07/10/2019  3:38 PM EDT ----- Regarding: Lab Orders for Friday 10.2.2020 Please place lab orders for Friday 10.2.2020, office visit for physical on Tuesday 10.6.2020 Thank you, Dyke Maes RT(R)

## 2019-07-20 ENCOUNTER — Other Ambulatory Visit (INDEPENDENT_AMBULATORY_CARE_PROVIDER_SITE_OTHER): Payer: No Typology Code available for payment source

## 2019-07-20 DIAGNOSIS — Z13220 Encounter for screening for lipoid disorders: Secondary | ICD-10-CM | POA: Diagnosis not present

## 2019-07-20 DIAGNOSIS — Z113 Encounter for screening for infections with a predominantly sexual mode of transmission: Secondary | ICD-10-CM

## 2019-07-20 LAB — LIPID PANEL
Cholesterol: 188 mg/dL (ref 0–200)
HDL: 73.6 mg/dL (ref >39.00)
LDL Cholesterol: 104 mg/dL — ABNORMAL HIGH (ref 0–99)
NonHDL: 114.25
Total CHOL/HDL Ratio: 3
Triglycerides: 49 mg/dL (ref 0.0–149.0)
VLDL: 9.8 mg/dL (ref 0.0–40.0)

## 2019-07-20 LAB — COMPREHENSIVE METABOLIC PANEL
ALT: 9 U/L (ref 0–35)
AST: 12 U/L (ref 0–37)
Albumin: 4 g/dL (ref 3.5–5.2)
Alkaline Phosphatase: 37 U/L — ABNORMAL LOW (ref 39–117)
BUN: 10 mg/dL (ref 6–23)
CO2: 28 mEq/L (ref 19–32)
Calcium: 9.2 mg/dL (ref 8.4–10.5)
Chloride: 105 mEq/L (ref 96–112)
Creatinine, Ser: 0.7 mg/dL (ref 0.40–1.20)
GFR: 88.43 mL/min (ref >60.00)
Glucose, Bld: 76 mg/dL (ref 70–99)
Potassium: 4.4 mEq/L (ref 3.5–5.1)
Sodium: 140 mEq/L (ref 135–145)
Total Bilirubin: 0.6 mg/dL (ref 0.2–1.2)
Total Protein: 6.2 g/dL (ref 6.0–8.3)

## 2019-07-20 NOTE — Progress Notes (Signed)
No critical labs need to be addressed urgently. We will discuss labs in detail at upcoming office visit.   

## 2019-07-23 LAB — HIV ANTIBODY (ROUTINE TESTING W REFLEX): HIV 1&2 Ab, 4th Generation: NONREACTIVE

## 2019-07-23 LAB — C. TRACHOMATIS/N. GONORRHOEAE RNA
C. trachomatis RNA, TMA: NOT DETECTED
N. gonorrhoeae RNA, TMA: NOT DETECTED

## 2019-07-23 LAB — RPR: RPR Ser Ql: NONREACTIVE

## 2019-07-23 NOTE — Progress Notes (Signed)
No critical labs need to be addressed urgently. We will discuss labs in detail at upcoming office visit.   

## 2019-07-24 ENCOUNTER — Encounter: Payer: No Typology Code available for payment source | Admitting: Family Medicine

## 2019-07-30 ENCOUNTER — Other Ambulatory Visit: Payer: Self-pay | Admitting: Family Medicine

## 2019-07-31 NOTE — Telephone Encounter (Signed)
Last office visit 02/02/2019 for GAD.  Last refilled 03/29/2019 for #60 with no refills.  CPE scheduled for 08/10/2019.

## 2019-08-10 ENCOUNTER — Other Ambulatory Visit: Payer: Self-pay

## 2019-08-10 ENCOUNTER — Encounter: Payer: No Typology Code available for payment source | Admitting: Family Medicine

## 2019-08-17 ENCOUNTER — Other Ambulatory Visit: Payer: Self-pay

## 2019-08-17 ENCOUNTER — Encounter: Payer: Self-pay | Admitting: Family Medicine

## 2019-08-17 ENCOUNTER — Ambulatory Visit (INDEPENDENT_AMBULATORY_CARE_PROVIDER_SITE_OTHER): Payer: No Typology Code available for payment source | Admitting: Family Medicine

## 2019-08-17 VITALS — BP 130/80 | HR 92 | Temp 98.4°F | Ht 66.0 in | Wt 140.5 lb

## 2019-08-17 DIAGNOSIS — Z0000 Encounter for general adult medical examination without abnormal findings: Secondary | ICD-10-CM | POA: Diagnosis not present

## 2019-08-17 DIAGNOSIS — Z23 Encounter for immunization: Secondary | ICD-10-CM

## 2019-08-17 DIAGNOSIS — R251 Tremor, unspecified: Secondary | ICD-10-CM

## 2019-08-17 DIAGNOSIS — F330 Major depressive disorder, recurrent, mild: Secondary | ICD-10-CM

## 2019-08-17 MED ORDER — FLUOXETINE HCL 10 MG PO CAPS: 30.0000 mg | ORAL_CAPSULE | Freq: Every day | ORAL | 3 refills | Status: DC

## 2019-08-17 MED ORDER — CETIRIZINE HCL 10 MG PO TABS: 10.0000 mg | ORAL_TABLET | Freq: Every day | ORAL | 3 refills | Status: DC

## 2019-08-17 MED ORDER — ALPRAZOLAM 0.5 MG PO TABS: 0.5000 mg | ORAL_TABLET | Freq: Two times a day (BID) | ORAL | 0 refills | Status: DC | PRN

## 2019-08-17 MED ORDER — FLUTICASONE PROPIONATE 50 MCG/ACT NA SUSP: 2.0000 | Freq: Every day | NASAL | 3 refills | Status: DC

## 2019-08-17 NOTE — Progress Notes (Signed)
Chief Complaint  Patient presents with  . Annual Exam    History of Present Illness: HPI  The patient is here for annual wellness exam and preventative care.     GAD/MDD panic attacks: Using alprazolam BID Increased stress in last year She is no longer seeing psychiatrist. She is currently on fluoxetine 30 mg daily .. takes higher dose med before mense.  PHQ today 16/30   She is going thorough menopause she believes.Marland Kitchen spotting and irratic menses.   Fibromyalgia, controlled with healthy lifestyle  She  Has a tremor... grandmother had similar. Dicussed propranolol... she will consider.   Diet: helathy  exercise: pilates 3 days a week.  COVID 19 screen No recent travel or known exposure to COVID19 The patient denies respiratory symptoms of COVID 19 at this time.  The importance of social distancing was discussed today.   Review of Systems  Constitutional: Negative for chills and fever.  HENT: Negative for congestion and ear pain.   Eyes: Negative for pain and redness.  Respiratory: Negative for cough and shortness of breath.   Cardiovascular: Negative for chest pain, palpitations and leg swelling.  Gastrointestinal: Negative for abdominal pain, blood in stool, constipation, diarrhea, nausea and vomiting.  Genitourinary: Negative for dysuria.  Musculoskeletal: Negative for falls and myalgias.  Skin: Negative for rash.  Neurological: Negative for dizziness.  Psychiatric/Behavioral: Negative for depression. The patient is not nervous/anxious.       Past Medical History:  Diagnosis Date  . Allergic rhinitis   . Anxiety disorder   . Depression   . Fracture of coccyx ([place])    H/O distantly  . GERD (gastroesophageal reflux disease)   . Gout   . IBS (irritable bowel syndrome)     reports that she has quit smoking. Her smoking use included cigarettes. She has never used smokeless tobacco. She reports current alcohol use. She reports that she does not use drugs.    Current Outpatient Medications:  .  ALPRAZolam (XANAX) 0.5 MG tablet, TAKE 1 TABLET(0.5 MG) BY MOUTH AT BEDTIME AS NEEDED FOR ANXIETY. MAY TAKE ADDITIONAL DAYTIME DOSE AS NEEDE, Disp: 60 tablet, Rfl: 0 .  cetirizine (ZYRTEC) 10 MG tablet, Take 10 mg by mouth daily., Disp: , Rfl:  .  FLUoxetine (PROZAC) 10 MG capsule, Take 3 capsules (30 mg total) by mouth daily., Disp: 90 capsule, Rfl: 5 .  fluticasone (FLONASE) 50 MCG/ACT nasal spray, Place 2 sprays into both nostrils daily., Disp: 48 g, Rfl: 3 .  ibuprofen (ADVIL,MOTRIN) 200 MG tablet, Take 200 mg by mouth every 6 (six) hours as needed., Disp: , Rfl:    Observations/Objective: Blood pressure 130/80, pulse 92, temperature 98.4 F (36.9 C), temperature source Temporal, height 5\' 6"  (1.676 m), weight 140 lb 8 oz (63.7 kg), last menstrual period 07/27/2019, SpO2 98 %.  Physical Exam Constitutional:      General: She is not in acute distress.    Appearance: Normal appearance. She is well-developed. She is not ill-appearing or toxic-appearing.  HENT:     Head: Normocephalic.     Right Ear: Hearing, tympanic membrane, ear canal and external ear normal.     Left Ear: Hearing, tympanic membrane, ear canal and external ear normal.     Nose: Nose normal.  Eyes:     General: Lids are normal. Lids are everted, no foreign bodies appreciated.     Conjunctiva/sclera: Conjunctivae normal.     Pupils: Pupils are equal, round, and reactive to light.  Neck:  Musculoskeletal: Normal range of motion and neck supple.     Thyroid: No thyroid mass or thyromegaly.     Vascular: No carotid bruit.     Trachea: Trachea normal.  Cardiovascular:     Rate and Rhythm: Normal rate and regular rhythm.     Heart sounds: Normal heart sounds, S1 normal and S2 normal. No murmur. No gallop.   Pulmonary:     Effort: Pulmonary effort is normal. No respiratory distress.     Breath sounds: Normal breath sounds. No wheezing, rhonchi or rales.  Abdominal:      General: Bowel sounds are normal. There is no distension or abdominal bruit.     Palpations: Abdomen is soft. There is no fluid wave or mass.     Tenderness: There is no abdominal tenderness. There is no guarding or rebound.     Hernia: No hernia is present.  Lymphadenopathy:     Cervical: No cervical adenopathy.  Skin:    General: Skin is warm and dry.     Findings: No rash.  Neurological:     Mental Status: She is alert.     Cranial Nerves: No cranial nerve deficit.     Sensory: No sensory deficit.  Psychiatric:        Mood and Affect: Mood is not anxious or depressed.        Speech: Speech normal.        Behavior: Behavior normal. Behavior is cooperative.        Judgment: Judgment normal.      Assessment and Plan The patient's preventative maintenance and recommended screening tests for an annual wellness exam were reviewed in full today. Brought up to date unless services declined.  Counselled on the importance of diet, exercise, and its role in overall health and mortality. The patient's FH and SH was reviewed, including their home life, tobacco status, and drug and alcohol status.     Vaccines: refused tdap, given flu today Nonsmoker Mammo:9/2020nml Colon: 2009,  Will plan next year HIV: refused Pap/DVE: HX of HPV, last pap 2018 neg and no HPV. Repeat in  5 years  MDD (major depressive disorder), recurrent episode (HCC) Moderate control.. work on stress reduction.. refuses refer to counselor or med change. Refilled alpraZolam and prozac.. no red flags on pdmp.  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 Consider BBlocker if worsening.   Kerby Nora, MD

## 2019-08-17 NOTE — Assessment & Plan Note (Signed)
Moderate control.. work on stress reduction.. refuses refer to counselor or med change. Refilled alpraZolam and prozac.. no red flags on pdmp.

## 2019-08-17 NOTE — Assessment & Plan Note (Signed)
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 Consider BBlocker if worsening.

## 2019-12-04 ENCOUNTER — Other Ambulatory Visit: Payer: Self-pay | Admitting: *Deleted

## 2019-12-04 MED ORDER — ALPRAZOLAM 0.5 MG PO TABS: 0.5000 mg | ORAL_TABLET | Freq: Two times a day (BID) | ORAL | 0 refills | Status: DC | PRN

## 2019-12-04 NOTE — Telephone Encounter (Signed)
Last office visit 08/17/2019 for CPE.  Last refilled 08/17/2019 (with note to not fill until 08/31/2019) for #60 with no refills.  No future appointments.

## 2020-01-07 ENCOUNTER — Other Ambulatory Visit: Payer: Self-pay | Admitting: *Deleted

## 2020-01-07 MED ORDER — LEVOCETIRIZINE DIHYDROCHLORIDE 5 MG PO TABS: 5.0000 mg | ORAL_TABLET | Freq: Every evening | ORAL | 3 refills | Status: DC

## 2020-01-07 NOTE — Telephone Encounter (Signed)
Received fax from [facility] requesting refill for Levocetirizine.  Not on current medication list.  Zyrtec is on current medication list.  Refill?

## 2020-03-07 ENCOUNTER — Other Ambulatory Visit: Payer: Self-pay | Admitting: Family Medicine

## 2020-03-07 NOTE — Telephone Encounter (Signed)
Last office visit 08/17/2019 for CPE.  Last refilled 12/04/2019 for #60 with no refills.  No future appointments.

## 2020-12-25 ENCOUNTER — Telehealth: Payer: Self-pay | Admitting: Family Medicine

## 2020-12-25 DIAGNOSIS — Z13220 Encounter for screening for lipoid disorders: Secondary | ICD-10-CM

## 2020-12-25 NOTE — Telephone Encounter (Signed)
-----   Message from Kara Haas J Walsh sent at 12/22/2020  3:04 PM EST ----- Regarding: Lab orders for Thursday, 3.24.22 Patient is scheduled for CPX labs, please order future labs, Thanks , Kara Haas   

## 2020-12-29 MED ORDER — FLUOXETINE HCL 10 MG PO CAPS: 30.0000 mg | ORAL_CAPSULE | Freq: Every day | ORAL | 0 refills | Status: DC

## 2021-01-08 ENCOUNTER — Other Ambulatory Visit: Payer: Self-pay

## 2021-01-08 ENCOUNTER — Other Ambulatory Visit (INDEPENDENT_AMBULATORY_CARE_PROVIDER_SITE_OTHER): Payer: No Typology Code available for payment source

## 2021-01-08 DIAGNOSIS — Z13220 Encounter for screening for lipoid disorders: Secondary | ICD-10-CM

## 2021-01-08 LAB — COMPREHENSIVE METABOLIC PANEL
ALT: 12 U/L (ref 0–35)
AST: 13 U/L (ref 0–37)
Albumin: 4.2 g/dL (ref 3.5–5.2)
Alkaline Phosphatase: 26 U/L — ABNORMAL LOW (ref 39–117)
BUN: 16 mg/dL (ref 6–23)
CO2: 27 mEq/L (ref 19–32)
Calcium: 9.3 mg/dL (ref 8.4–10.5)
Chloride: 104 mEq/L (ref 96–112)
Creatinine, Ser: 0.75 mg/dL (ref 0.40–1.20)
GFR: 91.88 mL/min (ref >60.00)
Glucose, Bld: 89 mg/dL (ref 70–99)
Potassium: 4.1 mEq/L (ref 3.5–5.1)
Sodium: 138 mEq/L (ref 135–145)
Total Bilirubin: 0.4 mg/dL (ref 0.2–1.2)
Total Protein: 6.6 g/dL (ref 6.0–8.3)

## 2021-01-08 LAB — LIPID PANEL
Cholesterol: 213 mg/dL — ABNORMAL HIGH (ref 0–200)
HDL: 68.3 mg/dL (ref >39.00)
LDL Cholesterol: 133 mg/dL — ABNORMAL HIGH (ref 0–99)
NonHDL: 144.24
Total CHOL/HDL Ratio: 3
Triglycerides: 57 mg/dL (ref 0.0–149.0)
VLDL: 11.4 mg/dL (ref 0.0–40.0)

## 2021-01-09 NOTE — Progress Notes (Signed)
No critical labs need to be addressed urgently. We will discuss labs in detail at upcoming office visit.   

## 2021-01-15 ENCOUNTER — Ambulatory Visit (INDEPENDENT_AMBULATORY_CARE_PROVIDER_SITE_OTHER): Payer: No Typology Code available for payment source | Admitting: Family Medicine

## 2021-01-15 ENCOUNTER — Other Ambulatory Visit: Payer: Self-pay

## 2021-01-15 VITALS — BP 140/70 | HR 57 | Temp 97.7°F | Ht 66.0 in | Wt 148.2 lb

## 2021-01-15 DIAGNOSIS — F411 Generalized anxiety disorder: Secondary | ICD-10-CM | POA: Diagnosis not present

## 2021-01-15 DIAGNOSIS — J301 Allergic rhinitis due to pollen: Secondary | ICD-10-CM | POA: Diagnosis not present

## 2021-01-15 DIAGNOSIS — Z1211 Encounter for screening for malignant neoplasm of colon: Secondary | ICD-10-CM

## 2021-01-15 DIAGNOSIS — F321 Major depressive disorder, single episode, moderate: Secondary | ICD-10-CM

## 2021-01-15 DIAGNOSIS — N924 Excessive bleeding in the premenopausal period: Secondary | ICD-10-CM

## 2021-01-15 DIAGNOSIS — Z0000 Encounter for general adult medical examination without abnormal findings: Secondary | ICD-10-CM | POA: Diagnosis not present

## 2021-01-15 LAB — TSH: TSH: 0.77 u[IU]/mL (ref 0.35–4.50)

## 2021-01-15 LAB — LUTEINIZING HORMONE: LH: 22.37 m[IU]/mL

## 2021-01-15 LAB — FOLLICLE STIMULATING HORMONE: FSH: 25.9 m[IU]/mL

## 2021-01-15 MED ORDER — LEVOCETIRIZINE DIHYDROCHLORIDE 5 MG PO TABS: 5.0000 mg | ORAL_TABLET | Freq: Every evening | ORAL | 3 refills | Status: DC

## 2021-01-15 MED ORDER — ALPRAZOLAM 0.5 MG PO TABS: 0.5000 mg | ORAL_TABLET | Freq: Two times a day (BID) | ORAL | 0 refills | Status: DC | PRN

## 2021-01-15 MED ORDER — FLUTICASONE PROPIONATE 50 MCG/ACT NA SUSP: 2.0000 | Freq: Every day | NASAL | 3 refills | Status: DC

## 2021-01-15 NOTE — Assessment & Plan Note (Signed)
Poor control..  Refill clonazapam.. encouraged limited use if able.. if BID needed... recommend considering cahnge of SSRI.

## 2021-01-15 NOTE — Assessment & Plan Note (Signed)
Worsened control.. start with referral to counseling and eval of hormones.. may be perimenopausal.  if not improving.. will likely need to increase prozac or change to different SSRI/SNRI etc.

## 2021-01-15 NOTE — Progress Notes (Signed)
Patient ID: [name], female    DOB: 1971/06/09, 50 y.o.   MRN: [id number]  This visit was conducted in person.  BP 140/70   Pulse (!) 57   Temp 97.7 F (36.5 C) (Temporal)   Ht 5\' 6"  (1.676 m)   Wt 148 lb 4 oz (67.2 kg)   LMP 01/14/2021 (Exact Date)   SpO2 100%   BMI 23.93 kg/m    CC:  Chief Complaint  Patient presents with  . Annual Exam    Wants hormone levels checked     Subjective:   HPI: SHAHRZAD KOBLE is a 50 y.o. female presenting on 01/15/2021 for Annual Exam (Wants hormone levels checked )  GAD/MDD:, panic attacks: Inadequate control on prozac 10 mg daily.. see below and using alprazolam 0.5 mg prn.. about 60 per year. PDMP reviewed during this encounter.   She request test of  hormones given she is having regular but heavier menses, emotional lability 2 weeks prior to menses... she usually increases the prozac 30 mg daily during this period of time. Has been going on for years, but worsening in last  Year, but in las 2 months it has been more continuous ( despite 30 mg daily) She has been having more night sweats and hot flashes in last few months.  PHQ9: 27  GAD7 16  Reviewed labs in detail. She has been doing KETO diet. Lab Results  Component Value Date   CHOL 213 (H) 01/08/2021   HDL 68.30 01/08/2021   LDLCALC 133 (H) 01/08/2021   TRIG 57.0 01/08/2021   CHOLHDL 3 01/08/2021  The 10-year ASCVD risk score 01/10/2021 DC Jr., et al., 2013) is: 3.7%   Values used to calculate the score:     Age: 22 years     Sex: Female     Is Non-Hispanic African American: No     Diabetic: No     Tobacco smoker: Yes     Systolic Blood Pressure: 140 mmHg     Is BP treated: No     HDL Cholesterol: 68.3 mg/dL     Total Cholesterol: 213 mg/dL  Allergic rhinitis: well controlled on flonase and Xyzal       Relevant past medical, surgical, family and social history reviewed and updated as indicated. Interim medical history since our last visit reviewed. Allergies  and medications reviewed and updated. Outpatient Medications Prior to Visit  Medication Sig Dispense Refill  . ALPRAZolam (XANAX) 0.5 MG tablet TAKE 1 TABLET BY MOUTH  TWICE DAILY AS NEEDED FOR  ANXIETY 60 tablet 0  . FLUoxetine (PROZAC) 10 MG capsule Take 3 capsules (30 mg total) by mouth daily. 270 capsule 0  . fluticasone (FLONASE) 50 MCG/ACT nasal spray Place 2 sprays into both nostrils daily. 48 g 3  . ibuprofen (ADVIL,MOTRIN) 200 MG tablet Take 200 mg by mouth every 6 (six) hours as needed.    44 levocetirizine (XYZAL) 5 MG tablet Take 1 tablet (5 mg total) by mouth every evening. 90 tablet 3  . cetirizine (ZYRTEC) 10 MG tablet Take 1 tablet (10 mg total) by mouth daily. (Patient not taking: Reported on 01/15/2021) 90 tablet 3   No facility-administered medications prior to visit.     Per HPI unless specifically indicated in ROS section below Review of Systems Objective:  BP 140/70   Pulse (!) 57   Temp 97.7 F (36.5 C) (Temporal)   Ht 5\' 6"  (1.676 m)   Wt 148 lb 4 oz (  67.2 kg)   LMP 01/14/2021 (Exact Date)   SpO2 100%   BMI 23.93 kg/m   Wt Readings from Last 3 Encounters:  01/15/21 148 lb 4 oz (67.2 kg)  08/17/19 140 lb 8 oz (63.7 kg)  03/30/18 146 lb 8 oz (66.5 kg)      Physical Exam    Results for orders placed or performed in visit on 01/08/21  Comprehensive metabolic panel  Result Value Ref Range   Sodium 138 135 - 145 mEq/L   Potassium 4.1 3.5 - 5.1 mEq/L   Chloride 104 96 - 112 mEq/L   CO2 27 19 - 32 mEq/L   Glucose, Bld 89 70 - 99 mg/dL   BUN 16 6 - 23 mg/dL   Creatinine, Ser 4.27 0.40 - 1.20 mg/dL   Total Bilirubin 0.4 0.2 - 1.2 mg/dL   Alkaline Phosphatase 26 (L) 39 - 117 U/L   AST 13 0 - 37 U/L   ALT 12 0 - 35 U/L   Total Protein 6.6 6.0 - 8.3 g/dL   Albumin 4.2 3.5 - 5.2 g/dL   GFR 06.23 >76.28 mL/min   Calcium 9.3 8.4 - 10.5 mg/dL  Lipid panel  Result Value Ref Range   Cholesterol 213 (H) 0 - 200 mg/dL   Triglycerides 31.5 0.0 - 149.0 mg/dL    HDL 17.61 >60.73 mg/dL   VLDL 71.0 0.0 - 62.6 mg/dL   LDL Cholesterol 948 (H) 0 - 99 mg/dL   Total CHOL/HDL Ratio 3    NonHDL 144.24     This visit occurred during the SARS-CoV-2 public health emergency.  Safety protocols were in place, including screening questions prior to the visit, additional usage of staff PPE, and extensive cleaning of exam room while observing appropriate contact time as indicated for disinfecting solutions.   COVID 19 screen:  No recent travel or known exposure to COVID19 The patient denies respiratory symptoms of COVID 19 at this time. The importance of social distancing was discussed today.   Assessment and Plan The patient's preventative maintenance and recommended screening tests for an annual wellness exam were reviewed in full today. Brought up to date unless services declined.  Counselled on the importance of diet, exercise, and its role in overall health and mortality. The patient's FH and SH was reviewed, including their home life, tobacco status, and drug and alcohol status.   Vaccines: refused tdap,  Consider shingrix... refused today.  Discussed COVID19 vaccine side effects and benefits. Strongly encouraged the patient to get the vaccine. Questions answered. Nonsmoker Mammo:9/2020nml..repeat shceduled Colon: 2009,  due HIV: refused Pap/DVE: HX of HPV, last pap 2018 neg and no HPV.Repeat in 5 years   Problem List Items Addressed This Visit    Allergic rhinitis   GAD (generalized anxiety disorder)    Poor control..  Refill clonazapam.. encouraged limited use if able.. if BID needed... recommend considering cahnge of SSRI.       Relevant Medications   ALPRAZolam (XANAX) 0.5 MG tablet   MDD (major depressive disorder), recurrent episode (HCC)    Worsened control.. start with referral to counseling and eval of hormones.. may be perimenopausal.  if not improving.. will likely need to increase prozac or change to different SSRI/SNRI etc.       Relevant Medications   ALPRAZolam (XANAX) 0.5 MG tablet    Other Visit Diagnoses    Routine general medical examination at a health care facility    -  Primary   Excessive bleeding in premenopausal  period       Relevant Orders   Estradiol   Follicle stimulating hormone   Luteinizing hormone   TSH   Progesterone   Colon cancer screening       Relevant Orders   Ambulatory referral to Gastroenterology       Kerby Nora, MD

## 2021-01-15 NOTE — Patient Instructions (Addendum)
Please stop at the lab to have labs drawn.  Continue for now 30 mg daily of prozac.  Get back on track with low cholesterol diet and regular exercise.

## 2021-01-16 LAB — ESTRADIOL: Estradiol: 55 pg/mL

## 2021-01-16 LAB — PROGESTERONE: Progesterone: 0.5 ng/mL

## 2021-02-10 LAB — HM PAP SMEAR

## 2021-02-10 LAB — HM MAMMOGRAPHY

## 2021-02-19 ENCOUNTER — Encounter: Payer: Self-pay | Admitting: Gastroenterology

## 2021-02-24 ENCOUNTER — Encounter: Payer: Self-pay | Admitting: Family Medicine

## 2021-03-14 ENCOUNTER — Other Ambulatory Visit: Payer: Self-pay

## 2021-03-17 MED ORDER — ALPRAZOLAM 0.5 MG PO TABS: 0.5000 mg | ORAL_TABLET | Freq: Two times a day (BID) | ORAL | 0 refills | Status: DC | PRN

## 2021-03-17 NOTE — Telephone Encounter (Signed)
Last OV 01/15/2021 Next OV NKA Last Filled 01/15/2021

## 2021-03-26 ENCOUNTER — Other Ambulatory Visit: Payer: Self-pay | Admitting: Family Medicine

## 2021-04-27 ENCOUNTER — Other Ambulatory Visit: Payer: Self-pay

## 2021-04-27 ENCOUNTER — Ambulatory Visit (AMBULATORY_SURGERY_CENTER): Payer: No Typology Code available for payment source | Admitting: *Deleted

## 2021-04-27 VITALS — Ht 66.0 in | Wt 152.0 lb

## 2021-04-27 DIAGNOSIS — Z1211 Encounter for screening for malignant neoplasm of colon: Secondary | ICD-10-CM

## 2021-04-27 MED ORDER — SUTAB 1479-225-188 MG PO TABS: 24.0000 | ORAL_TABLET | ORAL | 0 refills | Status: DC

## 2021-04-27 MED ORDER — ONDANSETRON HCL 4 MG PO TABS: 4.0000 mg | ORAL_TABLET | ORAL | 0 refills | Status: DC

## 2021-04-27 NOTE — Progress Notes (Signed)
Pt verified name, DOB, address and insurance during PV today. Pt mailed instruction packet to included paper to complete and mail back to [facility] with addressed and stamped envelope, Emmi video, copy of consent form to read and not return, and instructions. [employer] coupon mailed in packet. PV completed over the phone. Pt encouraged to call with questions or issues.  My Chart instructions to pt as well    No egg or soy allergy known to patient  No issues with past sedation with any surgeries or procedures Patient denies ever being told they had issues or difficulty with intubation  No FH of Malignant Hyperthermia No diet pills per patient No home 02 use per patient  No blood thinners per patient  Pt denies issues with constipation  No A fib or A flutter  EMMI video to pt or via MyChart  COVID 19 guidelines implemented in PV today with Pt and RN  Pt is fully vaccinated  for Covid   Pt states she will only do the pill prep- we discussed options, she said she  was deathly ill last colon ( movi) , she has talked with friends that did the pills and she is insistent on the tabs- did send in Zofran for nausea for her -   [employer] given to pt in PV today , Code to Pharmacy and  NO PA's for preps discussed with pt In PV today  Discussed with pt there will be an out-of-pocket cost for prep and that varies from $0 to 70 dollars   Due to the COVID-19 pandemic we are asking patients to follow certain guidelines.  Pt aware of COVID protocols and [facility] guidelines

## 2021-05-08 ENCOUNTER — Encounter: Payer: Self-pay | Admitting: Gastroenterology

## 2021-05-08 ENCOUNTER — Ambulatory Visit (AMBULATORY_SURGERY_CENTER): Payer: No Typology Code available for payment source | Admitting: Gastroenterology

## 2021-05-08 ENCOUNTER — Other Ambulatory Visit: Payer: Self-pay

## 2021-05-08 VITALS — BP 121/66 | HR 56 | Temp 98.4°F | Resp 10 | Ht 66.0 in | Wt 152.0 lb

## 2021-05-08 DIAGNOSIS — Z1211 Encounter for screening for malignant neoplasm of colon: Secondary | ICD-10-CM | POA: Diagnosis present

## 2021-05-08 MED ORDER — SODIUM CHLORIDE 0.9 % IV SOLN: 500.0000 mL | INTRAVENOUS | Status: DC

## 2021-05-08 NOTE — Patient Instructions (Signed)
YOU HAD AN ENDOSCOPIC PROCEDURE TODAY AT THE [facility]:   Refer to the procedure report that was given to you for any specific questions about what was found during the examination.  If the procedure report does not answer your questions, please call your gastroenterologist to clarify.  If you requested that your care partner not be given the details of your procedure findings, then the procedure report has been included in a sealed envelope for you to review at your convenience later.  YOU SHOULD EXPECT: Some feelings of bloating in the abdomen. Passage of more gas than usual.  Walking can help get rid of the air that was put into your GI tract during the procedure and reduce the bloating. If you had a lower endoscopy (such as a colonoscopy or flexible sigmoidoscopy) you may notice spotting of blood in your stool or on the toilet paper. If you underwent a bowel prep for your procedure, you may not have a normal bowel movement for a few days.  Please Note:  You might notice some irritation and congestion in your nose or some drainage.  This is from the oxygen used during your procedure.  There is no need for concern and it should clear up in a day or so.  SYMPTOMS TO REPORT IMMEDIATELY:   Following lower endoscopy (colonoscopy or flexible sigmoidoscopy):  Excessive amounts of blood in the stool  Significant tenderness or worsening of abdominal pains  Swelling of the abdomen that is new, acute  Fever of 100F or higher   Following upper endoscopy (EGD)  Vomiting of blood or coffee ground material  New chest pain or pain under the shoulder blades  Painful or persistently difficult swallowing  New shortness of breath  Fever of 100F or higher  Black, tarry-looking stools  For urgent or emergent issues, a gastroenterologist can be reached at any hour by calling [phone]. Do not use MyChart messaging for urgent concerns.    DIET:  We do recommend a small meal at first, but  then you may proceed to your regular diet.  Drink plenty of fluids but you should avoid alcoholic beverages for 24 hours.  ACTIVITY:  You should plan to take it easy for the rest of today and you should NOT DRIVE or use heavy machinery until tomorrow (because of the sedation medicines used during the test).    FOLLOW UP: Our staff will call the number listed on your records 48-72 hours following your procedure to check on you and address any questions or concerns that you may have regarding the information given to you following your procedure. If we do not reach you, we will leave a message.  We will attempt to reach you two times.  During this call, we will ask if you have developed any symptoms of COVID 19. If you develop any symptoms (ie: fever, flu-like symptoms, shortness of breath, cough etc.) before then, please call [phone].  If you test positive for Covid 19 in the 2 weeks post procedure, please call and report this information to us.    If any biopsies were taken you will be contacted by phone or by letter within the next 1-3 weeks.  Please call us at [phone] if you have not heard about the biopsies in 3 weeks.    SIGNATURES/CONFIDENTIALITY: You and/or your care partner have signed paperwork which will be entered into your electronic medical record.  These signatures attest to the fact that that the information above on   your After Visit Summary has been reviewed and is understood.  Full responsibility of the confidentiality of this discharge information lies with you and/or your care-partner. 

## 2021-05-08 NOTE — Op Note (Signed)
[facility] Patient Name: Kara Haas Procedure Date: 05/08/2021 2:00 PM MRN: [id number] Endoscopist: Rachael Fee , MD Age: 50 Referring MD:  Date of Birth: [birth year] Gender: Female Account #: [id number] Procedure:                Colonoscopy Indications:              Screening for colorectal malignant neoplasm Medicines:                Monitored Anesthesia Care Procedure:                Pre-Anesthesia Assessment:                           - Prior to the procedure, a History and Physical                            was performed, and patient medications and                            allergies were reviewed. The patient's tolerance of                            previous anesthesia was also reviewed. The risks                            and benefits of the procedure and the sedation                            options and risks were discussed with the patient.                            All questions were answered, and informed consent                            was obtained. Prior Anticoagulants: The patient has                            taken no previous anticoagulant or antiplatelet                            agents. ASA Grade Assessment: II - A patient with                            mild systemic disease. After reviewing the risks                            and benefits, the patient was deemed in                            satisfactory condition to undergo the procedure.                           After obtaining informed consent, the colonoscope  was passed under direct vision. Throughout the                            procedure, the patient's blood pressure, pulse, and                            oxygen saturations were monitored continuously. The                            Olympus CF-HQ190L [id number]) Colonoscope was                            introduced through the anus and advanced to the the                            cecum, identified  by appendiceal orifice and                            ileocecal valve. The colonoscopy was performed                            without difficulty. The patient tolerated the                            procedure well. The quality of the bowel                            preparation was good. The ileocecal valve,                            appendiceal orifice, and rectum were photographed. Scope In: 2:08:10 PM Scope Out: 2:19:36 PM Scope Withdrawal Time: 0 hours 5 minutes 58 seconds  Total Procedure Duration: 0 hours 11 minutes 26 seconds  Findings:                 The entire examined colon appeared normal on direct                            and retroflexion views. Complications:            No immediate complications. Estimated blood loss:                            None. Estimated Blood Loss:     Estimated blood loss: none. Impression:               - The entire examined colon is normal on direct and                            retroflexion views.                           - No polyps or cancers. Recommendation:           - Patient has a contact number available for  emergencies. The signs and symptoms of potential                            delayed complications were discussed with the                            patient. Return to normal activities tomorrow.                            Written discharge instructions were provided to the                            patient.                           - Resume previous diet.                           - Continue present medications.                           - Repeat colonoscopy in 10 years for screening. Rachael Fee, MD 05/08/2021 2:21:10 PM This report has been signed electronically.

## 2021-05-08 NOTE — Progress Notes (Signed)
pt tolerated well. VSS. awake and to recovery. Report given to RN.  

## 2021-05-08 NOTE — Progress Notes (Signed)
V/s [place] 

## 2021-05-12 ENCOUNTER — Telehealth: Payer: Self-pay

## 2021-05-12 NOTE — Telephone Encounter (Signed)
LVM

## 2021-06-09 ENCOUNTER — Other Ambulatory Visit: Payer: Self-pay

## 2021-06-09 MED ORDER — ALPRAZOLAM 0.5 MG PO TABS: 0.5000 mg | ORAL_TABLET | Freq: Two times a day (BID) | ORAL | 0 refills | Status: DC | PRN

## 2021-06-09 NOTE — Telephone Encounter (Signed)
Last filled 03-17-21 #60 Last OV/CPE 01-15-21 No Future OV [facility] [address]

## 2021-08-10 ENCOUNTER — Other Ambulatory Visit: Payer: Self-pay

## 2021-08-10 MED ORDER — ALPRAZOLAM 0.5 MG PO TABS: 0.5000 mg | ORAL_TABLET | Freq: Two times a day (BID) | ORAL | 0 refills | Status: DC | PRN

## 2021-08-10 NOTE — Telephone Encounter (Signed)
Last office visit 01/15/2021 for CPE.  Last refilled 06/09/2021 for #60 with no refills.  No future appointments.

## 2021-09-25 ENCOUNTER — Other Ambulatory Visit: Payer: Self-pay | Admitting: Family Medicine

## 2021-09-25 ENCOUNTER — Encounter: Payer: Self-pay | Admitting: *Deleted

## 2021-09-25 MED ORDER — ALPRAZOLAM 0.5 MG PO TABS: 0.5000 mg | ORAL_TABLET | Freq: Two times a day (BID) | ORAL | 0 refills | Status: DC | PRN

## 2021-09-25 MED ORDER — FLUOXETINE HCL 10 MG PO CAPS: 30.0000 mg | ORAL_CAPSULE | Freq: Every day | ORAL | 0 refills | Status: DC

## 2021-09-25 NOTE — Telephone Encounter (Signed)
Last office visit 01/15/2021 for CPE.  Last refilled 08/10/2021 for #60 with no refills.  No future appointments.

## 2021-09-25 NOTE — Telephone Encounter (Signed)
Received fax from [facility] requesting PA for Fluoxetine 10 mg capsules.  PA forms completed and faxed to [facility] at [phone].  It can take up to 72 hours for a decision.

## 2021-09-29 NOTE — Telephone Encounter (Signed)
PA approved effective 09/25/21 through 09/24/2022.  [facility] notified of approval via fax.

## 2021-10-01 DIAGNOSIS — A630 Anogenital (venereal) warts: Secondary | ICD-10-CM | POA: Diagnosis not present

## 2021-10-01 DIAGNOSIS — Z113 Encounter for screening for infections with a predominantly sexual mode of transmission: Secondary | ICD-10-CM | POA: Diagnosis not present

## 2021-10-01 DIAGNOSIS — N6320 Unspecified lump in the left breast, unspecified quadrant: Secondary | ICD-10-CM | POA: Diagnosis not present

## 2021-10-07 ENCOUNTER — Other Ambulatory Visit: Payer: Self-pay | Admitting: Obstetrics and Gynecology

## 2021-10-07 DIAGNOSIS — N6320 Unspecified lump in the left breast, unspecified quadrant: Secondary | ICD-10-CM

## 2021-11-11 ENCOUNTER — Telehealth: Payer: Self-pay | Admitting: Family Medicine

## 2021-11-11 NOTE — Telephone Encounter (Signed)
Last office visit 01/15/2021 for CPE.  Last refilled 09/25/21 for #60 with no refills.  No future appointments with PCP.

## 2021-11-12 MED ORDER — ALPRAZOLAM 0.5 MG PO TABS: 0.5000 mg | ORAL_TABLET | Freq: Two times a day (BID) | ORAL | 0 refills | Status: DC | PRN

## 2021-11-12 NOTE — Telephone Encounter (Signed)
Please schedule CPE with fasting labs prior for around/after 01/15/2021.

## 2021-11-12 NOTE — Telephone Encounter (Signed)
Pt scheduled cpe/lab in April 2023

## 2021-11-16 ENCOUNTER — Other Ambulatory Visit: Payer: Self-pay | Admitting: Obstetrics and Gynecology

## 2021-11-16 ENCOUNTER — Ambulatory Visit
Admission: RE | Admit: 2021-11-16 | Discharge: 2021-11-16 | Disposition: A | Discharge status: Home / Self Care | Payer: No Typology Code available for payment source | Source: Ambulatory Visit | Attending: Obstetrics and Gynecology | Admitting: Obstetrics and Gynecology

## 2021-11-16 ENCOUNTER — Ambulatory Visit
Admission: RE | Admit: 2021-11-16 | Discharge: 2021-11-16 | Disposition: A | Discharge status: Home / Self Care | Payer: BC Managed Care – PPO | Source: Ambulatory Visit | Attending: Obstetrics and Gynecology | Admitting: Obstetrics and Gynecology

## 2021-11-16 DIAGNOSIS — N6320 Unspecified lump in the left breast, unspecified quadrant: Secondary | ICD-10-CM

## 2021-11-16 DIAGNOSIS — R922 Inconclusive mammogram: Secondary | ICD-10-CM | POA: Diagnosis not present

## 2021-11-21 DIAGNOSIS — F330 Major depressive disorder, recurrent, mild: Secondary | ICD-10-CM | POA: Diagnosis not present

## 2021-11-25 DIAGNOSIS — F330 Major depressive disorder, recurrent, mild: Secondary | ICD-10-CM | POA: Diagnosis not present

## 2021-11-30 DIAGNOSIS — F330 Major depressive disorder, recurrent, mild: Secondary | ICD-10-CM | POA: Diagnosis not present

## 2021-12-07 DIAGNOSIS — F330 Major depressive disorder, recurrent, mild: Secondary | ICD-10-CM | POA: Diagnosis not present

## 2021-12-11 DIAGNOSIS — F330 Major depressive disorder, recurrent, mild: Secondary | ICD-10-CM | POA: Diagnosis not present

## 2021-12-22 ENCOUNTER — Other Ambulatory Visit: Payer: Self-pay

## 2021-12-22 ENCOUNTER — Encounter: Payer: Self-pay | Admitting: Family Medicine

## 2021-12-22 ENCOUNTER — Ambulatory Visit: Payer: BC Managed Care – PPO | Admitting: Family Medicine

## 2021-12-22 VITALS — BP 110/80 | HR 74 | Temp 98.6°F | Ht 66.0 in | Wt 151.0 lb

## 2021-12-22 DIAGNOSIS — F331 Major depressive disorder, recurrent, moderate: Secondary | ICD-10-CM

## 2021-12-22 DIAGNOSIS — Z13220 Encounter for screening for lipoid disorders: Secondary | ICD-10-CM

## 2021-12-22 DIAGNOSIS — R5383 Other fatigue: Secondary | ICD-10-CM | POA: Diagnosis not present

## 2021-12-22 LAB — CBC WITH DIFFERENTIAL/PLATELET
Basophils Absolute: 0 10*3/uL (ref 0.0–0.1)
Basophils Relative: 0.2 % (ref 0.0–3.0)
Eosinophils Absolute: 0 10*3/uL (ref 0.0–0.7)
Eosinophils Relative: 0.8 % (ref 0.0–5.0)
HCT: 41 % (ref 36.0–46.0)
Hemoglobin: 13.6 g/dL (ref 12.0–15.0)
Lymphocytes Relative: 37.5 % (ref 12.0–46.0)
Lymphs Abs: 2.2 10*3/uL (ref 0.7–4.0)
MCHC: 33.2 g/dL (ref 30.0–36.0)
MCV: 93.3 fl (ref 78.0–100.0)
Monocytes Absolute: 1.5 10*3/uL — ABNORMAL HIGH (ref 0.1–1.0)
Monocytes Relative: 25 % — ABNORMAL HIGH (ref 3.0–12.0)
Neutro Abs: 2.2 10*3/uL (ref 1.4–7.7)
Neutrophils Relative %: 36.5 % — ABNORMAL LOW (ref 43.0–77.0)
Platelets: 190 10*3/uL (ref 150.0–400.0)
RBC: 4.4 Mil/uL (ref 3.87–5.11)
RDW: 13.6 % (ref 11.5–15.5)
WBC: 6 10*3/uL (ref 4.0–10.5)

## 2021-12-22 LAB — VITAMIN D 25 HYDROXY (VIT D DEFICIENCY, FRACTURES): VITD: 41.67 ng/mL (ref 30.00–100.00)

## 2021-12-22 LAB — VITAMIN B12: Vitamin B-12: 349 pg/mL (ref 211–911)

## 2021-12-22 MED ORDER — LEVOCETIRIZINE DIHYDROCHLORIDE 5 MG PO TABS: 5.0000 mg | ORAL_TABLET | Freq: Every evening | ORAL | 3 refills | Status: DC

## 2021-12-22 MED ORDER — FLUTICASONE PROPIONATE 50 MCG/ACT NA SUSP
2.0000 | Freq: Every day | NASAL | 3 refills | Status: AC
Start: 2021-12-22 — End: ?

## 2021-12-22 MED ORDER — FLUOXETINE HCL 20 MG PO CAPS: 60.0000 mg | ORAL_CAPSULE | Freq: Every day | ORAL | 5 refills | Status: AC

## 2021-12-22 MED ORDER — ALPRAZOLAM 0.5 MG PO TABS: 0.5000 mg | ORAL_TABLET | Freq: Two times a day (BID) | ORAL | 0 refills | Status: DC | PRN

## 2021-12-22 NOTE — Assessment & Plan Note (Signed)
Acute worsening on chronic issue, poor control ? ? Increase prozac to 60 mg daily. Continue counseling. ? Trial of trazodone for sleep at night as alprazolam not ideal and possibly habit forming. ? Can use alprazolam prn panic attacks. ? follow up in 4 week for re-eval. ?

## 2021-12-22 NOTE — Patient Instructions (Addendum)
Increase prozac to 60 mg daily. ? Try 1/2 tab pf trazodone at night for sleep. ? Can  use alprazolam as needed for panic attack while prozac starting to work. ? Please stop at the lab to have labs drawn. ? ?

## 2021-12-22 NOTE — Progress Notes (Signed)
? ? Patient ID: Kara Haas, female    DOB: [birth year], 51 y.o.   MRN: [id number] ? ?This visit was conducted in person. ? ?BP 110/80   Pulse 74   Temp 98.6 ?F (37 ?C) (Oral)   Ht 5\' 6"  (1.676 m)   Wt 151 lb (68.5 kg)   SpO2 97%   BMI 24.37 kg/m?   ? ?CC:  ?Chief Complaint  ?Patient presents with  ? Anxiety  ? ? ?Subjective:  ? ?HPI: ?Kara Haas is a 51 y.o. female presenting on 12/22/2021 for Anxiety ? ?GAD.MDD:  Poor control, worsened. She lost her job unexpectedly last week. She has been having brain fog since COVID last year. ? Last OV 01/06/22 reviewed... at that tie GAD/MDD poorly controlled and she  was referred to a counselor ( she has been doing this once a week). We considered changing her SSRI but she held off. Mood had improved with IUD and estrogen patch ? ?She has been doing Yoga, mediatation, breath work, exercise. ? She has had worsening of anxiety and depression, feels overwhelmed. ? She  cannot  sleep. ? ? ?She is currently on prozac 30 mg daily no SE on this dose. ? Using alprazolam  once at night and occ taking an extra dose at night. ? Since she lost her job she has taken on in  to calm dow and meet the day. ? ?PHQ9: 23 ?GAD7:21 ?  ?   ? ?Relevant past medical, surgical, family and social history reviewed and updated as indicated. Interim medical history since our last visit reviewed. ?Allergies and medications reviewed and updated. ?Outpatient Medications Prior to Visit  ?Medication Sig Dispense Refill  ? ALPRAZolam (XANAX) 0.5 MG tablet Take 1 tablet (0.5 mg total) by mouth 2 (two) times daily as needed. for anxiety 60 tablet 0  ? estradiol (CLIMARA - DOSED IN MG/24 HR) 0.05 mg/24hr patch estradiol 0.05 mg/24 hr weekly transdermal patch ? Apply 1 patch every week by transdermal route.    ? FLUoxetine (PROZAC) 10 MG capsule Take 3 capsules (30 mg total) by mouth daily. 270 capsule 0  ? fluticasone (FLONASE) 50 MCG/ACT nasal spray Place 2 sprays into both nostrils daily. 48 g 3  ?  hydrocortisone 2.5 % ointment SMARTSIG:Sparingly Topical Twice Daily    ? ibuprofen (ADVIL,MOTRIN) 200 MG tablet Take 200 mg by mouth every 6 (six) hours as needed.    ? imiquimod (ALDARA) 5 % cream Apply topically 2 (two) times a week.    ? levocetirizine (XYZAL) 5 MG tablet Take 1 tablet (5 mg total) by mouth every evening. 90 tablet 3  ? ondansetron (ZOFRAN) 4 MG tablet Take 1 tablet (4 mg total) by mouth as directed. 2 tablet 0  ? Sodium Sulfate-Mag Sulfate-KCl (SUTAB) 972 145 8060 MG TABS Take 24 tablets by mouth as directed. MANUFACTURER CODES!! BIN: [id number] PCN: CN GROUP: [id number] MEMBER ID: [id number];BLT AS SECONDARY INSURANCE ;NO PRIOR AUTHORIZATION 24 tablet 0  ? ?No facility-administered medications prior to visit.  ?  ? ?Per HPI unless specifically indicated in ROS section below ?Review of Systems  ?Constitutional:  Negative for fatigue and fever.  ?HENT:  Negative for ear pain.   ?Eyes:  Negative for pain.  ?Respiratory:  Negative for chest tightness and shortness of breath.   ?Cardiovascular:  Negative for chest pain, palpitations and leg swelling.  ?Gastrointestinal:  Negative for abdominal pain.  ?Genitourinary:  Negative for dysuria.  ?Objective:  ?BP 110/80   Pulse 74  Temp 98.6 ?F (37 ?C) (Oral)   Ht 5\' 6"  (1.676 m)   Wt 151 lb (68.5 kg)   SpO2 97%   BMI 24.37 kg/m?   ?Wt Readings from Last 3 Encounters:  ?12/22/21 151 lb (68.5 kg)  ?05/08/21 152 lb (68.9 kg)  ?04/27/21 152 lb (68.9 kg)  ?  ?  ?Physical Exam ?Vitals and nursing note reviewed.  ?Constitutional:   ?   General: She is not in acute distress. ?   Appearance: Normal appearance. She is well-developed. She is not ill-appearing or toxic-appearing.  ?HENT:  ?   Head: Normocephalic.  ?   Right Ear: Hearing, tympanic membrane, ear canal and external ear normal.  ?   Left Ear: Hearing, tympanic membrane, ear canal and external ear normal.  ?   Nose: Nose normal.  ?Eyes:  ?   General: Lids are normal. Lids are everted, no foreign  bodies appreciated.  ?   Conjunctiva/sclera: Conjunctivae normal.  ?   Pupils: Pupils are equal, round, and reactive to light.  ?Neck:  ?   Thyroid: No thyroid mass or thyromegaly.  ?   Vascular: No carotid bruit.  ?   Trachea: Trachea normal.  ?Cardiovascular:  ?   Rate and Rhythm: Normal rate and regular rhythm.  ?   Heart sounds: Normal heart sounds, S1 normal and S2 normal. No murmur heard. ?  No gallop.  ?Pulmonary:  ?   Effort: Pulmonary effort is normal. No respiratory distress.  ?   Breath sounds: Normal breath sounds. No wheezing, rhonchi or rales.  ?Abdominal:  ?   General: Bowel sounds are normal. There is no distension or abdominal bruit.  ?   Palpations: Abdomen is soft. There is no fluid wave or mass.  ?   Tenderness: There is no abdominal tenderness. There is no guarding or rebound.  ?   Hernia: No hernia is present.  ?Musculoskeletal:  ?   Cervical back: Normal range of motion and neck supple.  ?Lymphadenopathy:  ?   Cervical: No cervical adenopathy.  ?Skin: ?   General: Skin is warm and dry.  ?   Findings: No rash.  ?Neurological:  ?   Mental Status: She is alert.  ?   Cranial Nerves: No cranial nerve deficit.  ?   Sensory: No sensory deficit.  ?Psychiatric:     ?   Mood and Affect: Mood is anxious. Mood is not depressed. Affect is tearful.     ?   Speech: Speech is rapid and pressured.     ?   Behavior: Behavior is agitated. Behavior is cooperative.     ?   Thought Content: Thought content normal. Thought content does not include suicidal ideation. Thought content does not include homicidal or suicidal plan.     ?   Judgment: Judgment normal.  ? ?   ?Results for orders placed or performed in visit on 02/24/21  ?HM MAMMOGRAPHY  ?Result Value Ref Range  ? HM Mammogram 0-4 Bi-Rad 0-4 Bi-Rad, Self Reported Normal  ?HM PAP SMEAR  ?Result Value Ref Range  ? HM Pap smear    ?  Negative for intraepithelial lesion or malignanacy. Reactive Cellular changes and/or repair are present/HPV Negative  ? ? ?This  visit occurred during the SARS-CoV-2 public health emergency.  Safety protocols were in place, including screening questions prior to the visit, additional usage of staff PPE, and extensive cleaning of exam room while observing appropriate contact time as indicated for disinfecting solutions.  ? ?  COVID 19 screen:  No recent travel or known exposure to COVID19 ?The patient denies respiratory symptoms of COVID 19 at this time. ?The importance of social distancing was discussed today.  ? ?Assessment and Plan ? ?  ?Problem List Items Addressed This Visit   ? ? MDD (major depressive disorder), recurrent episode (HCC)  ?   Acute worsening on chronic issue, poor control ? ? Increase prozac to 60 mg daily. Continue counseling. ? Trial of trazodone for sleep at night as alprazolam not ideal and possibly habit forming. ? Can use alprazolam prn panic attacks. ? follow up in 4 week for re-eval. ?  ?  ? Relevant Medications  ? FLUoxetine (PROZAC) 20 MG capsule  ? ALPRAZolam (XANAX) 0.5 MG tablet  ? ?Other Visit Diagnoses   ? ? Screening cholesterol level    -  Primary  ? Other fatigue      ? Relevant Orders  ? CBC with Differential/Platelet  ? Vitamin B12  ? VITAMIN D 25 Hydroxy (Vit-D Deficiency, Fractures)  ? ?  ? ?Meds ordered this encounter  ?Medications  ? fluticasone (FLONASE) 50 MCG/ACT nasal spray  ?  Sig: Place 2 sprays into both nostrils daily.  ?  Dispense:  48 g  ?  Refill:  3  ? levocetirizine (XYZAL) 5 MG tablet  ?  Sig: Take 1 tablet (5 mg total) by mouth every evening.  ?  Dispense:  90 tablet  ?  Refill:  3  ? FLUoxetine (PROZAC) 20 MG capsule  ?  Sig: Take 3 capsules (60 mg total) by mouth at bedtime.  ?  Dispense:  90 capsule  ?  Refill:  5  ? ALPRAZolam (XANAX) 0.5 MG tablet  ?  Sig: Take 1 tablet (0.5 mg total) by mouth 2 (two) times daily as needed. for anxiety  ?  Dispense:  60 tablet  ?  Refill:  0  ? ? ? ?Kerby Nora, MD  ? ?

## 2021-12-28 DIAGNOSIS — F330 Major depressive disorder, recurrent, mild: Secondary | ICD-10-CM | POA: Diagnosis not present

## 2021-12-29 ENCOUNTER — Other Ambulatory Visit: Payer: Self-pay | Admitting: Family Medicine

## 2022-01-20 ENCOUNTER — Telehealth: Payer: Self-pay | Admitting: Family Medicine

## 2022-01-20 DIAGNOSIS — Z13220 Encounter for screening for lipoid disorders: Secondary | ICD-10-CM

## 2022-01-20 NOTE — Telephone Encounter (Signed)
-----   Message from Ellamae Sia sent at 01/12/2022  2:22 PM EDT ----- ?Regarding: Lab orders for Thursday, 4.6.23 ?Lab orders, no f/u appt ? ?

## 2022-01-21 ENCOUNTER — Other Ambulatory Visit: Payer: BC Managed Care – PPO

## 2022-01-29 ENCOUNTER — Encounter: Payer: BC Managed Care – PPO | Admitting: Family Medicine

## 2022-03-22 ENCOUNTER — Other Ambulatory Visit: Payer: Self-pay | Admitting: Family Medicine

## 2022-03-23 MED ORDER — ALPRAZOLAM 0.5 MG PO TABS: 0.5000 mg | ORAL_TABLET | Freq: Two times a day (BID) | ORAL | 0 refills | Status: AC | PRN

## 2022-03-23 NOTE — Telephone Encounter (Signed)
Last office visit 12/22/2021 for anxiety.  Last refilled 12/22/21 for #60 with no refills.  No future appointments.

## 2022-04-27 ENCOUNTER — Other Ambulatory Visit: Payer: Self-pay | Admitting: Sports Medicine

## 2022-04-27 ENCOUNTER — Ambulatory Visit
Admission: RE | Admit: 2022-04-27 | Discharge: 2022-04-27 | Disposition: A | Discharge status: Home / Self Care | Payer: BLUE CROSS/BLUE SHIELD | Source: Ambulatory Visit | Attending: Sports Medicine | Admitting: Sports Medicine

## 2022-04-27 DIAGNOSIS — M5441 Lumbago with sciatica, right side: Secondary | ICD-10-CM

## 2022-05-03 ENCOUNTER — Encounter: Payer: Self-pay | Admitting: Family Medicine

## 2022-05-03 ENCOUNTER — Encounter (INDEPENDENT_AMBULATORY_CARE_PROVIDER_SITE_OTHER): Payer: BLUE CROSS/BLUE SHIELD | Admitting: Family Medicine

## 2022-05-03 DIAGNOSIS — F339 Major depressive disorder, recurrent, unspecified: Secondary | ICD-10-CM

## 2022-05-11 NOTE — Telephone Encounter (Signed)
Please see the MyChart message reply(ies) for my assessment and plan.  The patient gave consent for this Medical Advice Message and is aware that it may result in a bill to their insurance company as well as the possibility that this may result in a co-payment or deductible. They are an established patient, but are not seeking medical advice exclusively about a problem treated during an in person or video visit in the last 7 days. I did not recommend an in person or video visit within 7 days of my reply.  I spent a total of 10 minutes cumulative time within 7 days through MyChart messaging Nilah Belcourt, MD  

## 2022-05-12 MED ORDER — BUPROPION HCL ER (XL) 150 MG PO TB24
150.0000 mg | ORAL_TABLET | Freq: Every day | ORAL | 3 refills | Status: AC
Start: 2022-05-12 — End: ?

## 2022-05-12 NOTE — Addendum Note (Signed)
Addended byKerby Nora E on: 05/12/2022 02:55 PM   Modules accepted: Orders

## 2022-05-12 NOTE — Telephone Encounter (Signed)
Patient called about the medication Wellbutrin and wanted to know if Dr. Ermalene Searing can sent the prescription in to be filled.

## 2023-01-12 ENCOUNTER — Other Ambulatory Visit: Payer: Self-pay | Admitting: Family Medicine

## 2023-01-12 NOTE — Telephone Encounter (Signed)
Please call and schedule CPE with fasting labs prior for Dr. Bedsole.  

## 2023-01-12 NOTE — Telephone Encounter (Signed)
Patient has switched pcp due to insurance change

## 2023-04-14 ENCOUNTER — Other Ambulatory Visit: Payer: Self-pay | Admitting: Family Medicine
